# Patient Record
Sex: Male | Born: 1935 | Race: White | Hispanic: No | Marital: Married | Smoking: Former smoker
Health system: Southern US, Community
[De-identification: ages and names within clinical notes are randomized; demographics above are authoritative.]

## PROBLEM LIST (undated history)

## (undated) DIAGNOSIS — E669 Obesity, unspecified: Secondary | ICD-10-CM

## (undated) DIAGNOSIS — R0989 Other specified symptoms and signs involving the circulatory and respiratory systems: Secondary | ICD-10-CM

## (undated) DIAGNOSIS — E1151 Type 2 diabetes mellitus with diabetic peripheral angiopathy without gangrene: Secondary | ICD-10-CM

## (undated) DIAGNOSIS — M5416 Radiculopathy, lumbar region: Secondary | ICD-10-CM

## (undated) DIAGNOSIS — J189 Pneumonia, unspecified organism: Secondary | ICD-10-CM

## (undated) DIAGNOSIS — E1142 Type 2 diabetes mellitus with diabetic polyneuropathy: Secondary | ICD-10-CM

## (undated) DIAGNOSIS — R52 Pain, unspecified: Secondary | ICD-10-CM

## (undated) DIAGNOSIS — I429 Cardiomyopathy, unspecified: Secondary | ICD-10-CM

## (undated) DIAGNOSIS — J449 Chronic obstructive pulmonary disease, unspecified: Secondary | ICD-10-CM

## (undated) DIAGNOSIS — E785 Hyperlipidemia, unspecified: Secondary | ICD-10-CM

## (undated) DIAGNOSIS — I10 Essential (primary) hypertension: Secondary | ICD-10-CM

## (undated) DIAGNOSIS — I739 Peripheral vascular disease, unspecified: Secondary | ICD-10-CM

## (undated) DIAGNOSIS — I639 Cerebral infarction, unspecified: Secondary | ICD-10-CM

## (undated) DIAGNOSIS — I2510 Atherosclerotic heart disease of native coronary artery without angina pectoris: Secondary | ICD-10-CM

## (undated) HISTORY — DX: Atherosclerotic heart disease of native coronary artery without angina pectoris: I25.10

## (undated) HISTORY — DX: Cardiomyopathy, unspecified: I42.9

## (undated) HISTORY — DX: Other specified symptoms and signs involving the circulatory and respiratory systems: R09.89

## (undated) HISTORY — PX: OTHER SURGICAL HISTORY: SHX169

## (undated) HISTORY — DX: Type 2 diabetes mellitus with diabetic peripheral angiopathy without gangrene: E11.51

## (undated) HISTORY — DX: Chronic obstructive pulmonary disease, unspecified: J44.9

## (undated) HISTORY — DX: Hyperlipidemia, unspecified: E78.5

## (undated) HISTORY — DX: Peripheral vascular disease, unspecified: I73.9

## (undated) HISTORY — DX: Cerebral infarction, unspecified: I63.9

## (undated) HISTORY — PX: CORONARY ARTERY BYPASS GRAFT: SHX141

## (undated) HISTORY — DX: Pneumonia, unspecified organism: J18.9

## (undated) HISTORY — DX: Radiculopathy, lumbar region: M54.16

## (undated) HISTORY — DX: Essential (primary) hypertension: I10

## (undated) HISTORY — DX: Pain, unspecified: R52

## (undated) HISTORY — DX: Obesity, unspecified: E66.9

## (undated) HISTORY — DX: Type 2 diabetes mellitus with diabetic polyneuropathy: E11.42

---

## 2006-08-25 ENCOUNTER — Ambulatory Visit: Payer: Self-pay | Admitting: Vascular Surgery

## 2006-09-03 ENCOUNTER — Ambulatory Visit (HOSPITAL_COMMUNITY): Admission: RE | Admit: 2006-09-03 | Discharge: 2006-09-03 | Payer: Self-pay | Admitting: Vascular Surgery

## 2006-09-03 ENCOUNTER — Ambulatory Visit: Payer: Self-pay | Admitting: Vascular Surgery

## 2006-09-22 ENCOUNTER — Ambulatory Visit: Payer: Self-pay | Admitting: Vascular Surgery

## 2006-10-15 ENCOUNTER — Ambulatory Visit: Payer: Self-pay | Admitting: Vascular Surgery

## 2006-10-15 ENCOUNTER — Inpatient Hospital Stay (HOSPITAL_COMMUNITY): Admission: RE | Admit: 2006-10-15 | Discharge: 2006-10-20 | Payer: Self-pay | Admitting: Vascular Surgery

## 2006-10-16 ENCOUNTER — Encounter: Payer: Self-pay | Admitting: Vascular Surgery

## 2006-10-27 ENCOUNTER — Ambulatory Visit: Payer: Self-pay | Admitting: Vascular Surgery

## 2009-02-24 IMAGING — CR DG ANG/EXT/UNI/OR RIGHT
1 series · 1 of 1 positions shown · non-contrast
Comparison: none

CLINICAL DATA: Popliteal stenosis. Superficial femoral artery to anterior tibial
artery bypass

RIGHT EXTREMITY INTRAOPERATIVE ANGIOGRAM:

[view not recorded]
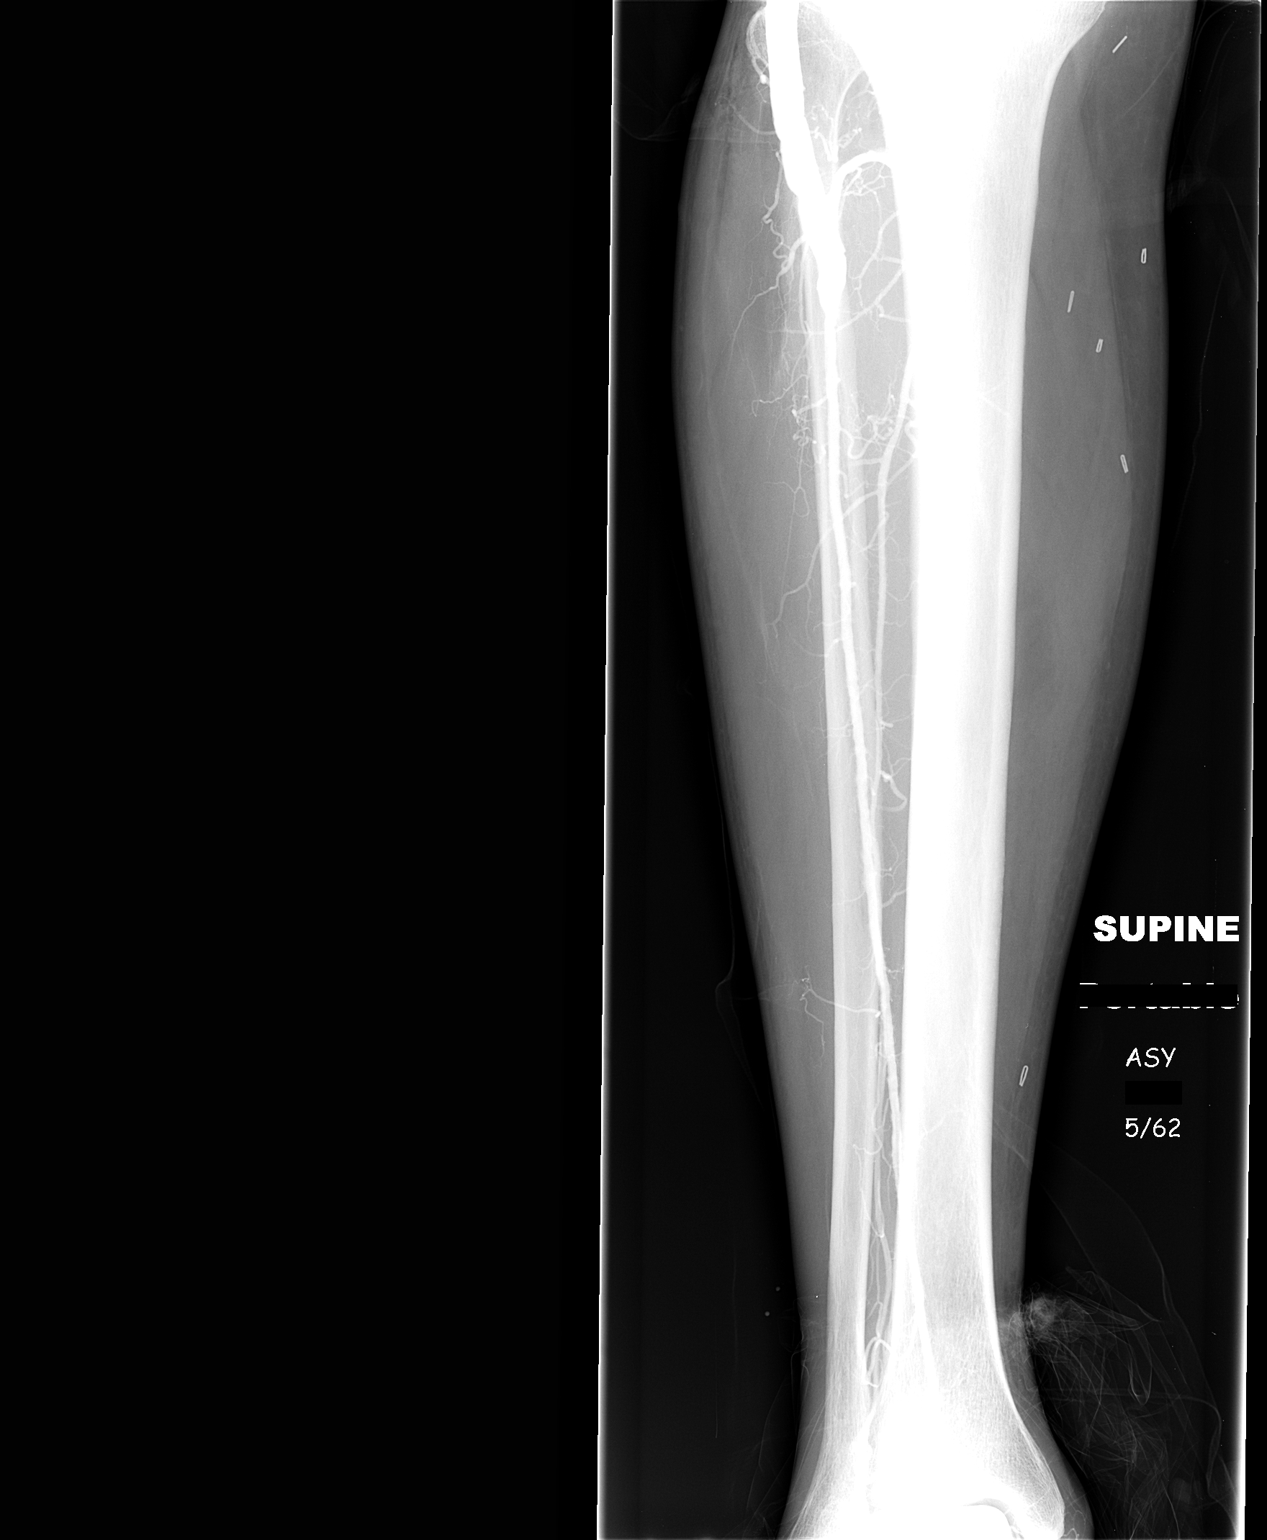

[1 of 1 positions shown; findings below may reference images not displayed]

FINDINGS: Single image is  submitted from an intraoperative angiogram. This
shows the distal portion of a right lower extremity bypass graft, which appears
to the tie into the proximal right anterior tibial artery. Visualized anterior
tibial artery is patent. Dorsalis pedis is noted and patent at the ankle.
Visualized portions of the peroneal artery is patent. Posterior tibial artery
not well visualized beyond the proximal calf.
IMPRESSION: As above.

## 2010-07-16 NOTE — Procedures (Signed)
VASCULAR LAB EXAM   INDICATION:  Evaluation of greater saphenous veins prior to lower  extremity re-vascularization.   HISTORY:  Diabetes:  Non-insulin dependent.  Cardiac:  MI in 1989.  Coronary artery bypass graft at [facility] in 1989 with  right knee to ankle greater saphenous vein harvest.  Hypertension:  Yes.   EXAM:  Bilateral lower extremity greater saphenous vein mapping.   IMPRESSION:  Small saphenous veins are partially compressible, secondary  to chronic thrombus.   Right greater saphenous vein measures 0.5 to 0.2 cm from the groin to  the knee.  Distal to the knee, the vein has been harvested.   Left greater saphenous vein measures 0.9 cm to 0.7 cm, groin to proximal  calf.  The vein penetrates the fascia superficially at the mid-thigh.  Past the proximal left calf, the greater saphenous vein became a  tortuous varicose vein.   ___________________________________________  Quita Skye. Hart Rochester, M.D.   MC/MEDQ  D:  09/22/2006  T:  09/23/2006  Job:  [id number]

## 2010-07-16 NOTE — Procedures (Signed)
VASCULAR LAB EXAM   INDICATION:   HISTORY:  Diabetes:  Cardiac:  Hypertension:   EXAM:   IMPRESSION:   ADDENDUM:   ___________________________________________  Quita Skye. Hart Rochester, M.D.   MC/MEDQ  D:  09/24/2006  T:  09/24/2006  Job:  [id number]

## 2010-07-16 NOTE — Consult Note (Signed)
VASCULAR SURGERY CONSULTATION   Gelpi, YATES  DOB:  01-Mar-1932                                       08/25/2006  CHART#:[id number]   Patient is a 77 year old male patient who has had progressive calf  claudication and ankle discomfort over the last few years, in  particularly over the past several months.  He works at [employer], and  this is effecting his ability to ambulate significantly.  He develops  calf discomfort and ankle discomfort with the feet then becoming numb  and tingling, beginning on the right side.  It requires 10-15 minutes  for this to resolve.  He has had no rest pain, history of infection, or  nonhealing ulcers.  Lower extremity arterial Doppler studies done at  [facility] in April, 2008 revealed evidence of  severe right popliteal artery stenosis and possible occlusion of the  left popliteal artery.   PAST MEDICAL HISTORY:  1. Non-insulin-dependent diabetes.  2. Hypertension.  3. Coronary artery disease with myocardial infarction in 1989.      Negative stress test one year ago.  4. Hyperlipidemia.  5. Negative for COPD and stroke.   PAST SURGICAL HISTORY:  Coronary artery bypass grafting at [facility] in 1989  with removal of right saphenous vein from the knee to the ankle.   FAMILY HISTORY:  Positive for diabetes mellitus in his mother, negative  for coronary artery disease and strokes.   SOCIAL HISTORY:  He is married.  Has two children.  Is retired.  He does  not smoke since 1989, does not use alcohol.   ALLERGIES:  None.   REVIEW OF SYSTEMS:  Please see health history form.   MEDICATIONS:  Please see health history form.   PHYSICAL EXAMINATION:  Blood pressure is 128/72, heart rate 79,  respirations 12.  Generally, he is a male patient.  He is in no apparent  distress.  Alert and oriented x3.  Neck is supple with 3+ carotid pulses  palpable.  No bruits are audible.  Neurologic:  Normal.  No palpable  adenopathy  in the neck.  Chest clear to auscultation.  Cardiovascular  exam reveals a regular rhythm with no murmurs.  His abdomen is soft and  nontender with no palpable masses.  He has 3+ femoral and popliteal  pulse palpable on the right with no distal pulses palpable.  Left foot  has a 3+ femoral and a 1+ popliteal pulse with no distal pulses  palpable.  Both feet are slightly cool but adequately perfused.  There  is no evidence of infection or ischemia.   I suspect that he does have severe popliteal disease bilaterally, and we  will proceed with an angiogram on July 3 and see what options are  available.  If he is a candidate of PTA and stenting of the popliteal  artery, we may proceed at that time.  I have discussed this with him and  his family.  It should be noted that he also has some varicosities in  his left greater saphenous system on the pretibial region on the left  side between the knee and the ankle, but there are no varicosities in  the thigh.  He presumably has greater saphenous vein still present from  the right groin to the knee.   Quita Skye Hart Rochester, M.D.  Electronically Signed  JDL/MEDQ  D:  08/25/2006  T:  08/26/2006  Job:  [id number]   cc:   Dr. Virgie Dad

## 2010-07-16 NOTE — Op Note (Signed)
NAME:  [name] [name], [name] [name] NO.:  [id number]   MEDICAL RECORD NO.:  [id number]          PATIENT TYPE:  INP   LOCATION:  3313                         FACILITY:  [facility]   PHYSICIAN:  Quita Skye. Hart Rochester, M.D.  DATE OF BIRTH:  10-23-1931   DATE OF PROCEDURE:  10/15/2006  DATE OF DISCHARGE:                               OPERATIVE REPORT   PREOPERATIVE DIAGNOSIS:  Severe claudication, right leg, with some rest  ischemia secondary to superficial femoral, popliteal and tibial  occlusive disease.   POSTOPERATIVE DIAGNOSIS:  Severe claudication, right leg, with some rest  ischemia secondary to superficial femoral, popliteal and tibial  occlusive disease.   OPERATION:  Right proximal superficial femoral artery to anterior tibial  artery bypass using a non-reverse translocated saphenous vein graft from  the left leg with intraoperative arteriogram.   FIRST ASSISTANT:  Shonna Chock.   ANESTHESIA:  General endotracheal.   PROCEDURE:  The patient was taken to the operating room, placed in the  supine position at which time satisfactory general endotracheal  anesthesia was administered.  Both legs were prepped with Betadine scrub  solution and draped in a routine sterile manner.  Saphenous vein was  exposed at the saphenofemoral junction on the left side and removed down  to the proximal calf.  There was valvular incompetence throughout the  saphenous vein.  It was a very large vein but no varicosities were  located in the vein itself down to the midcalf where it became involved  with multiple varicosities in the branches.  The vein was removed, it  gently dilated heparinized saline, marked for orientation purposes.  It  was a diffusely large vein but was adequate for use.  Anterior tibial  artery was exposed through a lateral incision in the proximal aspect of  the right leg where it was a mildly diseased vessel and a large vessel  but widely patent on the angiogram.   Superficial femoral artery was then  exposed in the proximal third of the thigh where there was adequate  length for the vein that was available.  It was dissected free through  one longitudinal incision anteriorly and it was a diffusely diseased  vessel but had an excellent pulse.  The subcutaneous lateral tunnel was  then created and the patient was heparinized.  The superficial femoral  artery was occluded proximally and distally, opened with a 15 blade and  extended with Potts scissors.  Proximal end of the vein was spatulated,  anastomosed end-to-side with 6-0 Prolene.  Clamps were then released.  The valvulotome was passed up the vein but no valves were incompetent.  Following this the vein was carefully delivered through the tunnel then  anastomosed end-to-side to the anterior tibial artery in an end-to-side  fashion with 6-0 Prolene.  Anterior tibial artery itself was widely  patent but diffusely diseased and at least 2.5 mm in size.  Following  placed in this there was an intraoperative arteriogram performed which  revealed a widely patent anastomosis with one-vessel runoff to the  anterior tibial artery.  Protamine was given to reverse the heparin.  Following adequate hemostasis wound was irrigated with saline, closed in  layers with Vicryl in subcuticular fashion, sterile dressing applied.  The patient was taken to recovery room in satisfactory condition.      Quita Skye Hart Rochester, M.D.  Electronically Signed     JDL/MEDQ  D:  10/15/2006  T:  10/16/2006  Job:  [id number]

## 2010-07-16 NOTE — H&P (Signed)
HISTORY AND PHYSICAL EXAMINATION   September 22, 2006   Re:  [name], [name] VALENZA               DOB:  1931-12-02   CHIEF COMPLAINT:  Severe claudication of bilateral lower extremities,  right worse than left, secondary to popliteal and tibial occlusive  disease.   HISTORY OF PRESENT ILLNESS:  This 77 year old male patient has developed  progressive, severe calf claudication and ankle discomfort over the past  few years, particularly over the last several months.  He works at [employer] and this is affecting his ability to ambulate significantly.  He  has severe calf discomfort and also ankle discomfort and then the feet  become numb and tingling, beginning on the right side.  It requires 10-  15 minutes for this to resolve.  He has no history of rest pain,  infection or nonhealing ulcer.  Lower extremity arterial Dopplers at  [facility] in April 2008 revealed severe popliteal disease  bilaterally and an angiogram performed by Dr. Hart [name] revealed widely  patent but diffusely diseased superficial femoral arteries bilaterally  with severe popliteal disease, the best runoff vessel below the knee  being the anterior tibial artery on both sides.  He was scheduled for  superficial femoral to anterior tibial bypass grafting on the right.   PAST MEDICAL HISTORY:  1. Noninsulin-dependent diabetes mellitus.  2. Hypertension.  3. Coronary artery disease, myocardial infarction in 1985.  Recent      negative stress test done at [facility] on September 16, 2006,      with no evidence of ischemia, cleared for surgery.  4. Hyperlipidemia.  5. Negative for COPD and stroke.   Previous surgery includes coronary artery bypass grafting at [facility] in  1989 with removal of right saphenous vein from the knee to the ankle.   FAMILY HISTORY:  Positive for diabetes mellitus in his mother.  Negative  for coronary artery disease and stroke.   SOCIAL HISTORY:  He is married and has 2  children.  He is retired.  Has  not smoked since 1989.  Does not use alcohol.   ALLERGIES:  None known.   REVIEW OF SYSTEMS:  Denies any chest pain, dyspnea on exertion, PND,  orthopnea, anorexia, weight loss, or any other generalized symptoms.  Denies productive cough, bronchitis, asthma, wheezing or hemoptysis.  No  other generalized symptoms noted.   MEDICATIONS:  1. Slo-Niacin 500 mcg daily.  2. Metformin 500 mg four daily.  3. Nadolol 80 mg one at night.  4. Lipitor 80 mg one at night.  5. Enalapril maleate 20 mg two daily.  6. Lyrica 75 mg one daily.   PHYSICAL EXAMINATION:  His blood pressure is 117/69, heart rate 79,  respirations are 18.  General:  He is a male patient who is in no acute  distress, alert and oriented x3.  His neck is supple, 3+ carotid pulses  palpable.  No bruits are audible.  Neurologic exam is normal.  No  palpable adenopathy in the neck.  Chest:  Clear to auscultation.  Cardiovascular exam reveals a regular rhythm with no murmur.  Abdomen:  Soft, nontender, with no palpable mass.  He has 3+ femoral and popliteal  pulse on the right with no distal pulses palpable.  Left foot has a 3+  femoral and 1+ popliteal pulse, and no distal pulse is palpable.  Both  feet are slightly cool but adequately perfused.  He does have some  varicose veins in the left leg beginning in the calf distally.  The  right greater saphenous vein has been removed from the ankle to the  knee.  Recent vein mapping revealed greater saphenous vein to be present  and adequate down to the mid calf and the right greater saphenous vein  to be borderline but adequate from the groin to the distal thigh.   IMPRESSION:  1. Severe claudication and some rest ischemia, right leg greater than      the left, secondary to popliteal and tibial occlusive disease.  2. Coronary artery disease, currently asymptomatic, negative stress      test.  3. Noninsulin-dependent diabetes mellitus.  4.  Hypertension.  5. Hyperlipidemia.   Plan is to admit the patient August 14 for a right superficial femoral  to anterior tibial bypass, probably using vein from right leg but  possibly using vein from left leg as well.  The risks and benefits have  been discussed with the patient and he would like to proceed.   [name] [name], M.D.  Electronically Signed   JDL/MEDQ  D:  09/22/2006  T:  09/23/2006  Job:  [id number]   cc:   Harl Bowie, M.D.

## 2010-07-16 NOTE — Op Note (Signed)
NAME:  [name] [name], [name] [name]               ACCOUNT NO.:  [id number]   MEDICAL RECORD NO.:  [id number]          PATIENT TYPE:  AMB   LOCATION:  SDS                          FACILITY:  [facility]   PHYSICIAN:  Quita Skye. Hart Rochester, M.D.  DATE OF BIRTH:  [birth year]   DATE OF PROCEDURE:  09/03/2006  DATE OF DISCHARGE:                               OPERATIVE REPORT   PREOPERATIVE DIAGNOSIS:  Severe bilateral calf claudication and rest  ischemia secondary to popliteal and tibial occlusive disease.   PROCEDURES:  1. Abdominal aortogram with bilateral lower extremity runoff via left      common femoral approach.  2. Selective catheterization, right common iliac artery, with right      lower extremity angiogram.   SURGEON:  Quita Skye. Hart Rochester, M.D.   ANESTHESIA:  Local Xylocaine.   CONTRAST:  160 mL.   COMPLICATIONS:  None.   DESCRIPTION OF PROCEDURE:  The patient was taken to [facility]  peripheral endovascular lab, placed in the supine position, at which  time both groins were prepped with Betadine solution, draped in a  routine sterile manner.  After infiltration of 1% Xylocaine, the left  femoral artery was entered.  A guidewire was passed into the suprarenal  aorta under fluoroscopic guidance.  A 5-French sheath and dilator were  passed over the guidewire, dilator removed, and a standard pigtail  catheter positioned in the suprarenal aorta.  Flush abdominal aortogram  was performed injecting 20 mL of contrast at 20 mL per second.  This  revealed the aorta to be widely patent but tortuous.  There were single  renal arteries bilaterally with no areas of significant stenosis.  The  inferior mesenteric artery was patent.  Both common, internal and  external iliac arteries were tortuous but widely patent.  The catheter  was withdrawn into the terminal aorta and bilateral lower extremity  runoff performed, injecting 88 mL of contrast at 8 mL per second.  On  the left side the common femoral, superficial  and profunda femoris  arteries were widely patent with no stenosis.  The SFA distally was also  widely patent but the popliteal artery became severely diseased and  essentially totally occluded at the knee joint with reconstitution of  the below-knee popliteal artery, which had severe disease at the origin  of the tibioperoneal trunk and the origin of the anterior tibial artery.  Runoff was through the anterior tibial and peroneal arteries on the left  with the posterior tibial being totally occluded.  The anterior tibial  artery was a good vessel throughout most of its length except for the  proximal 2-3 cm.  The right common iliac artery was then selectively  cannulated with an IMA catheter and right lower extremity angiogram  performed using the peek-hold technique to supplement the findings of  the bilateral lower extremity runoff.  This revealed the right common  femoral, profunda femoral and superficial femoral arteries to be patent  with no significant stenosis until the distal thigh, where there was  some mild narrowing of the superficial femoral artery in the 30% range.  There was  total occlusion of the popliteal artery essentially at the  level of the knee joint with multiple severely calcified plaques  extending down to the tibioperoneal trunk.  The posterior tibial artery  was occluded.  The anterior tibial was opened from its origin distally  and was the best vessel, and the peroneal artery was also opened  throughout.  Having tolerated procedure well, the sheath was removed,  adequate compression applied.  No complications ensued.   FINDINGS:  1. A widely patent aortoiliac system.  2. Bilateral patent superficial femoral arteries.  3. Total occlusion of the left popliteal artery with two-vessel runoff      via peroneal and anterior tibial arteries, with severe disease of      the tibioperoneal trunk.  4. Severe occlusive disease of right popliteal artery with total       occlusion of the right posterior tibial artery with best runoff via      the anterior tibial and peroneal artery.      Quita Skye Hart Rochester, M.D.  Electronically Signed     JDL/MEDQ  D:  09/03/2006  T:  09/03/2006  Job:  [id number]

## 2010-07-16 NOTE — Discharge Summary (Signed)
NAME:  [name] [name], [name] [name] NO.:  [id number]   MEDICAL RECORD NO.:  [id number]          PATIENT TYPE:  INP   LOCATION:  2024                         FACILITY:  [facility]   PHYSICIAN:  Quita Skye. Hart Rochester, M.D.  DATE OF BIRTH:  Jun 06, 1931   DATE OF ADMISSION:  10/15/2006  DATE OF DISCHARGE:  10/20/2006                               DISCHARGE SUMMARY   This is an addendum to a previously dictated discharge summary, see job  number [id number] for details, outlining patient's admission and discharge  diagnoses, procedures, history and hospital course through October 19, 2006.   CONTINUATION OF HOSPITAL COURSE:  As previously dictated, it was  anticipated that [name] [name] would be ready for discharge home on October 20, 2006; however, the night before he complained of voiding very  frequently with a sensation of burning.  On physical exam, it was felt  that his bladder was somewhat distended; therefore a Foley catheter was  inserted and revealed 1400 mL of urine.  This was sent for urinalysis  and urine culture and sensitivities, but results are still pending.  Dr.  Hart Rochester spoke with urologist, Dr. Gaynelle [name], who felt that the patient  should be started on Flomax nightly and would be okay for discharge home  with his Foley catheter with a night time drainage bag and leg drainage  bag with plans for him to be seen by the nurse practitioner in 1 week.  Patient was in agreement with this plan and subsequently discharged home  on October 20, 2006.  A home health nurse has been ordered to assist with  Foley catheter followup once he gets home.  Of note, we also discussed  continuation of his use of Lyrica.  This had initially been started for  his severe claudication of the right leg by a family doctor.  The  patient has a few tablets left at home.  At this point, the plan is for  him to complete these, then see how he does without the Lyrica, but a 1  month new prescription was written  in case he still needed Lyrica for  pain control, but I did discuss that he should not need to be on this  long term since he is now status post bypass, which should improve his  claudication symptoms.   ADDITIONAL DISCHARGE DIAGNOSES:  Overflow incontinence requiring  reinsertion of Foley catheter.   UPDATED DISCHARGE MEDICATION LIST:  1. Lyrica 75 mg p.o. q.a.m., with plans to discontinue this as his      pain improves.  2. Metformin 500 mg 2 tablets p.o. b.i.d.  3. Nadolol 80 mg p.o. q.h.s.  4. Enalapril 20 mg p.o. b.i.d.  5. Lipitor 80 mg p.o. q.h.s.  6. Niacin 500 mg p.o. q.h.s.  7. Percocet 5/325 mg 1-2 tablets p.o. q.4 hours p.r.n. pain.  8. Flomax 0.4 mg p.o. q.h.s.   DISCHARGE INSTRUCTIONS:  Discharge instructions are as previously  dictated with the exception that he is now being discharged home with a  Foley catheter.  While in the hospital, the nurse will go over  Foley  catheter care  at home with instructions regarding exchanging leg bag and night time  drainage bag.  He is to call Dr. Gaynelle [name]' office at [phone] for  followup appointment with his nurse practitioner, [name] [name], on October 27, 2006.   I have also asked that the urine culture results be sent to Dr. Gaynelle [name]' office when available.      [name] [name], P.A.      Quita Skye Hart Rochester, M.D.  Electronically Signed    AWZ/MEDQ  D:  10/20/2006  T:  10/20/2006  Job:  [id number]   cc:   [name] [name], M.D.  [facility]  [name] [name], M.D.

## 2010-07-16 NOTE — Assessment & Plan Note (Signed)
OFFICE VISIT   Staiger, STEPHAN NELIS  DOB:  August 06, 1931                                       10/27/2006  ZOXWR#:[id number]   The patient is status post right superficial femoral anterior tibial  bypass for severe claudication symptoms in the right leg with lesser  symptoms in the left leg, both of which were limiting.  His distal  saphenous vein had been removed from the right leg for coronary artery  bypass grafting.  We removed his left saphenous vein which was a large  vein because of valvular incompetence, and used it on the right side for  superficial femoral to anterior tibial bypass which has functioned  nicely.  His claudication symptoms have been relieved.   On exam, he has an excellent pulse in the graft which runs anterolateral  to the knee, and a well-perfused right foot with nice healing of the  incisions.  He has no distal edema.  The left leg has popliteal  occlusion with a patent above-knee superficial femoral artery and a  below-knee popliteal artery, and he is a candidate for superficial  femoral artery to popliteal grafting using the residual vein from his  right leg.  He will see how his symptoms progress, and if he would like  to proceed with that be back in touch with [place], otherwise he will be  followed on the protocol.   Quita Skye Hart Rochester, M.D.  Electronically Signed   JDL/MEDQ  D:  10/27/2006  T:  10/28/2006  Job:  [id number]

## 2010-07-16 NOTE — Discharge Summary (Signed)
NAME:  [name] [name], [name] [name] NO.:  [id number]   MEDICAL RECORD NO.:  [id number]          PATIENT TYPE:  INP   LOCATION:  2024                         FACILITY:  [facility]   PHYSICIAN:  Quita Skye. Hart Rochester, M.D.  DATE OF BIRTH:  1931/03/29   DATE OF ADMISSION:  10/15/2006  DATE OF DISCHARGE:                               DISCHARGE SUMMARY   ADMISSION DIAGNOSIS:  Severe claudication, right leg with rest pain  ischemia secondary to superficial femoral popliteal and tibial occlusive  disease.   DISCHARGE/SECONDARY DIAGNOSES:  1. Severe claudication, right leg with rest pain ischemia secondary to      superficial femoral popliteal and tibial occlusive disease status      post right superficial femoral anterior tibial artery bypass.  2. Diabetes mellitus type 2.  3. Hypertension.  4. Coronary artery disease with history of myocardial infarction in      1985 (resent negative stress test at [facility] in July of      2008).  5. Hyperlipidemia.  6. Coronary artery bypass grafting at [facility]      with removal of right saphenous vein from the right knee to ankle.  7. Recently treated for shingles.   PROCEDURES:  On October 15, 2006, he underwent right superficial femoral  artery to anterior tibial artery bypass using non-reverse translocated  saphenous vein graft from the left lower extremity with intraoperative  arteriogram x1.   SURGEON:  Dr. Josephina Gip   BRIEF HISTORY:  [name] [name] is a 77 year old Caucasian male who  developed progressive severe claudication and ankle discomfort over the  past few years, particularly over the last several months.  He works at  [employer] and felt this was affecting his ability to ambulate  significantly.  He complained of severe calf discomfort and ankle  discomfort.  In addition, his feet were becoming numb and tingly, but  more so on the right side.  It required about ten to 15 minutes for this  to resolve.  He  denies history of rest pain, infection or nonhealing  ulcerations.  He had a lower extremity arterial Doppler study at  [facility] in April of 2008 which revealed severe popliteal  disease bilaterally.  He subsequently underwent an angiogram by Dr.  Hart Rochester revealing widely patent, but diffusely diseased superficial  femoral arteries bilaterally with severe popliteal disease.  It appears  that the best run off is below the knee with anterior tibial artery on  both sides.  Dr. Hart Rochester recommended superficial femoral anterior tibial  artery bypass grafting.  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 Hart Rochester  discussed using vein from his left leg in order to avoid splicing vein.   HOSPITAL COURSE:  [name] [name] was electively admitted to [facility] October 15, 2006; he underwent the above-mentioned procedure.  His postoperative ABIs were greater than 1.0 on the right and the ABI on  the left was not obtainable due to calcification.  He had a PT pressure  of 120 with dampened  monophasic signal, anterior tibial was greater than  300 with monophasic signal; this was on the left.  At the time of  dictation, his postoperative course has been relatively uneventful  although he has been slow to mobilize.  Pain was initially an issue, but  deemed to be improving day to day.  He did request rolling walker for  home and this has been arranged.  The incisions have been healing well  without signs of infection.  He has a patent right femoral arterial  tibial artery graft.  He did have some positive constipation  postoperatively and has required laxatives, stool softeners and Fleet  enemas  have been ordered if needed, with no results.  He has been  tolerating a diabetic-appropriate diet and has been on a NovoLog insulin  sliding scale.  His metformin was resumed on postoperative day three and  sugars have been  relatively well controlled.  Hemodynamically, he has  been stable; last vitals show blood pressure 110/68, heart rate of 60s  to 70s in sinus rhythm; he has been afebrile and saturating 94% on room  air.  He has had some mild lower extremity edema, more pronounced on the  right side than the left, but it appears to be improving with elevation.  He was able to void following removal of his Foley catheter.  He was  actually felt ready for discharge around postoperative day four, but  still was not ambulating much outside of his room and again was having  constipation.  Therefore, it was felt he should remain hospitalized for  an additional day with plans to discharge home on postoperative day  five, October 20, 2006, if he continues to make progress with mobility.  Will consider home health physical therapy evaluation if necessary.  Currently, he remains in a stable condition.   His postoperative labs show a white blood count of 8.4, hemoglobin 11.1,  hematocrit 32, platelet count 132, sodium 135, potassium 4.2, BUN 13,  creatinine 0.95 and a blood glucose 123.  Preoperative LFTs were normal  except an alkaline phosphatase was slightly decreased at 31.   DISCHARGE MEDICATIONS:  1. __________ p.o. q a.m.  2. Metformin 500 two tablets p.o. b.i.d.  3. Labetalol 80 mg p.o. q.h.s.  4. Enalapril 20 mg p.o. b.i.d.  5. Lipitor 80 mg p.o. q.h.s.  6. __________ 500 mg p.o. q.h.s.  7. Percocet 5/325 one to two tablets p.o. q.4 hours p.r.n. pain.   DISCHARGE INSTRUCTIONS:  1. He is to continue diabetic-appropriate diet, clean his incision      with soap and water, increase activity slowly, avoid driving and      heavy lifting for the next three weeks; he may shower.  2. Should call if he develops fever greater than 101 or redness or      drainage from his incision site or increased pain.  3. See Dr. Hart Rochester __________ approximately three weeks with      postoperative ABIs and Doppler study and  staple removal appointment      on October 27, 2006; our office will contact him regarding specific      appointment date and time.      Jerold Coombe, P.A.      Quita Skye Hart Rochester, M.D.  Electronically Signed    AWZ/MEDQ  D:  10/19/2006  T:  10/19/2006  Job:  [id number]   cc:   Harl Bowie, M.D.  [facility]

## 2010-10-03 DIAGNOSIS — G629 Polyneuropathy, unspecified: Secondary | ICD-10-CM | POA: Insufficient documentation

## 2010-10-03 DIAGNOSIS — E119 Type 2 diabetes mellitus without complications: Secondary | ICD-10-CM | POA: Insufficient documentation

## 2010-10-03 DIAGNOSIS — B351 Tinea unguium: Secondary | ICD-10-CM

## 2010-12-13 LAB — CBC
HCT: 32 — ABNORMAL LOW
MCHC: 34.5
MCV: 93.7
Platelets: 132 — ABNORMAL LOW

## 2010-12-13 LAB — URINE CULTURE: Colony Count: NO GROWTH

## 2010-12-13 LAB — BASIC METABOLIC PANEL
BUN: 13
CO2: 30
Chloride: 100
Creatinine, Ser: 0.95

## 2010-12-16 LAB — COMPREHENSIVE METABOLIC PANEL
AST: 22
Albumin: 3.9
Calcium: 9.2
Chloride: 105
Creatinine, Ser: 0.79
GFR calc Af Amer: >60
Total Bilirubin: 0.7
Total Protein: 6.7

## 2010-12-16 LAB — CBC
MCV: 92.2
Platelets: 156
RDW: 13.6
WBC: 6.3

## 2010-12-16 LAB — ABO/RH: ABO/RH(D): A POS

## 2010-12-16 LAB — PROTIME-INR: INR: 0.9

## 2010-12-16 LAB — APTT: aPTT: 32

## 2010-12-16 LAB — URINALYSIS, ROUTINE W REFLEX MICROSCOPIC
Nitrite: NEGATIVE
Specific Gravity, Urine: 1.031 — ABNORMAL HIGH
Urobilinogen, UA: 0.2
pH: 6

## 2010-12-16 LAB — CROSSMATCH
ABO/RH(D): A POS
Antibody Screen: NEGATIVE

## 2010-12-17 LAB — COMPREHENSIVE METABOLIC PANEL
AST: 24
Albumin: 3.9
BUN: 20
CO2: 26
Calcium: 9.7
Chloride: 104
Creatinine, Ser: 0.66
GFR calc Af Amer: >60
GFR calc non Af Amer: >60
Total Bilirubin: 0.9

## 2010-12-17 LAB — PROTIME-INR: INR: 0.9

## 2010-12-17 LAB — CBC
HCT: 41.7
MCHC: 34.1
MCV: 93.2
Platelets: 175
WBC: 7.3

## 2010-12-17 LAB — APTT: aPTT: 27

## 2012-12-06 ENCOUNTER — Other Ambulatory Visit: Payer: Self-pay | Admitting: *Deleted

## 2012-12-23 ENCOUNTER — Ambulatory Visit: Payer: Self-pay

## 2013-01-03 ENCOUNTER — Encounter: Payer: Self-pay | Admitting: Vascular Surgery

## 2013-01-04 ENCOUNTER — Other Ambulatory Visit: Payer: Self-pay

## 2013-01-04 ENCOUNTER — Encounter: Payer: Self-pay | Admitting: Vascular Surgery

## 2013-01-04 ENCOUNTER — Ambulatory Visit (INDEPENDENT_AMBULATORY_CARE_PROVIDER_SITE_OTHER): Payer: Medicare Other | Admitting: Vascular Surgery

## 2013-01-04 ENCOUNTER — Ambulatory Visit (HOSPITAL_COMMUNITY)
Admission: RE | Admit: 2013-01-04 | Discharge: 2013-01-04 | Disposition: A | Discharge status: Home / Self Care | Payer: Medicare Other | Source: Ambulatory Visit | Attending: Vascular Surgery | Admitting: Vascular Surgery

## 2013-01-04 VITALS — BP 159/86 | HR 87 | Resp 20 | Ht 69.0 in | Wt 223.0 lb

## 2013-01-04 DIAGNOSIS — Z48812 Encounter for surgical aftercare following surgery on the circulatory system: Secondary | ICD-10-CM | POA: Insufficient documentation

## 2013-01-04 DIAGNOSIS — I739 Peripheral vascular disease, unspecified: Secondary | ICD-10-CM | POA: Insufficient documentation

## 2013-01-04 DIAGNOSIS — Z01810 Encounter for preprocedural cardiovascular examination: Secondary | ICD-10-CM

## 2013-01-04 DIAGNOSIS — I771 Stricture of artery: Secondary | ICD-10-CM | POA: Insufficient documentation

## 2013-01-04 NOTE — Progress Notes (Addendum)
Subjective:     Patient ID: [name] [name], male   DOB: 01-04-1932, 79 y.o.   MRN: [id number]  HPI this 79 year old male is known to me having previously undergone right proximal superficial femoral to anterior tibial bypass using saphenous vein from left leg in 2008 for severe claudication symptoms. Vein graft is functioning nicely over the last 6 years but he is now having severe claudication in the right calf and also some degree of rest discomfort which is atypical in the thigh and calf area. He was evaluated by Dr. Dulce Sellar  who obtained an arterial duplex scan which revealed total occlusion of anterior tibial artery distal to the vein graft. He was referred for further evaluation. He denies any nonhealing ulcers or gangrene or infection in the right foot. He does have numbness in the right foot which is a chronic problem due to neuropathy.  Past Medical History  Diagnosis Date  . Diabetes mellitus   . Painful veins   . Peripheral arterial disease   . Hypertension   . Hyperlipidemia   . CAD (coronary artery disease)   . Cardiomyopathy     History  Substance Use Topics  . Smoking status: Former Smoker    Types: Cigarettes    Quit date: 01/04/1993  . Smokeless tobacco: Never Used  . Alcohol Use: No    Family History  Problem Relation Age of Onset  . Cancer Mother   . Diabetes Mother     No Known Allergies  Current outpatient prescriptions:aspirin 81 MG tablet, Take 81 mg by mouth daily., Disp: , Rfl: ;  carvedilol (COREG) 25 MG tablet, Take 25 mg by mouth 2 (two) times daily with a meal. Take 1 1/2 tablet every 12 hours, Disp: , Rfl: ;  enalapril (VASOTEC) 20 MG tablet, Take 20 mg by mouth daily.  , Disp: , Rfl: ;  fish oil-omega-3 fatty acids 1000 MG capsule, Take 2 g by mouth daily., Disp: , Rfl:  Metformin HCl 500 MG/5ML SOLN, Take by mouth.  , Disp: , Rfl: ;  nabumetone (RELAFEN) 750 MG tablet, Take 750 mg by mouth 2 (two) times daily., Disp: , Rfl: ;  niacin (NIASPAN) 500 MG  CR tablet, Take 500 mg by mouth at bedtime.  , Disp: , Rfl: ;  rosuvastatin (CRESTOR) 40 MG tablet, Take 40 mg by mouth daily., Disp: , Rfl: ;  atorvastatin (LIPITOR) 40 MG tablet, Take 40 mg by mouth daily.  , Disp: , Rfl:  nadolol (CORGARD) 80 MG tablet, Take 80 mg by mouth daily.  , Disp: , Rfl:   BP 159/86  Pulse 87  Resp 20  Ht 5\' 9"  (1.753 m)  Wt 223 lb (101.152 kg)  BMI 32.92 kg/m2  Body mass index is 32.92 kg/(m^2).         Review of Systems denies chest pain, dyspnea on exertion, PND, orthopnea, hemoptysis. Main problem is pain in right leg as outlined and numbness in right foot. Other systems is negative and complete review of symptoms    Objective:   Physical Exam BP 159/86  Pulse 87  Resp 20  Ht 5\' 9"  (1.753 m)  Wt 223 lb (101.152 kg)  BMI 32.92 kg/m2  Gen.-alert and oriented x3 in no apparent distress HEENT normal for age Lungs no rhonchi or wheezing Cardiovascular regular rhythm no murmurs carotid pulses 3+ palpable no bruits audible Abdomen soft nontender no palpable masses Musculoskeletal free of  major deformities Skin clear -no rashes Neurologic normal Lower extremities  3+ femoral pulses bilaterally. Right leg has a subcutaneously placed femoral to anterior tibial bypass which is easily visible beneath the skin-dilated-has an excellent pulse. No palpable pulses in the right foot. He does have intact motion and decreased sensation. Left leg has no palpable popliteal or distal pulses.  I reviewed the lower extremity arterial study report performed in Dr. Hulen Shouts office which revealed calcified tibial vessels on the right with noncompressible vessels. It appears that the anterior tibial artery is totally occluded distal to the functioning vein graft on the right.       Assessment:     Recurrent severe claudication and some degree of rest pain-atypical with functioning right femoral anterior tibial vein graft and severe tibial occlusive disease    Plan:      #1 we'll obtain upper Trinity vein mapping today in the office #2 schedule for angiogram per Dr. Standley Brooking on Thursday, November 6 to see if patient candidate for revision of femoral anterior tibial bypass      patient had bilateral upper extremity vein mapping in the office today which are reviewed and interpreted. He is left-handed. It appears that his right upper arm cephalic vein would be satisfactory for conduit if he needs a short segment of vein

## 2013-01-05 MED ORDER — SODIUM CHLORIDE 0.9 % IV SOLN
INTRAVENOUS | Status: DC
  Administered 2013-01-06: 08:00:00 via INTRAVENOUS

## 2013-01-06 ENCOUNTER — Ambulatory Visit (HOSPITAL_COMMUNITY)
Admission: RE | Admit: 2013-01-06 | Discharge: 2013-01-06 | Disposition: A | Discharge status: Home / Self Care | Payer: Medicare Other | Source: Ambulatory Visit | Attending: Vascular Surgery | Admitting: Vascular Surgery

## 2013-01-06 ENCOUNTER — Other Ambulatory Visit: Payer: Self-pay | Admitting: *Deleted

## 2013-01-06 ENCOUNTER — Encounter (HOSPITAL_COMMUNITY)
Admission: RE | Disposition: A | Discharge status: Home / Self Care | Payer: Self-pay | Source: Ambulatory Visit | Attending: Vascular Surgery

## 2013-01-06 ENCOUNTER — Telehealth: Payer: Self-pay | Admitting: Vascular Surgery

## 2013-01-06 DIAGNOSIS — I10 Essential (primary) hypertension: Secondary | ICD-10-CM | POA: Insufficient documentation

## 2013-01-06 DIAGNOSIS — I2510 Atherosclerotic heart disease of native coronary artery without angina pectoris: Secondary | ICD-10-CM | POA: Insufficient documentation

## 2013-01-06 DIAGNOSIS — E785 Hyperlipidemia, unspecified: Secondary | ICD-10-CM | POA: Insufficient documentation

## 2013-01-06 DIAGNOSIS — I70219 Atherosclerosis of native arteries of extremities with intermittent claudication, unspecified extremity: Secondary | ICD-10-CM | POA: Insufficient documentation

## 2013-01-06 DIAGNOSIS — I739 Peripheral vascular disease, unspecified: Secondary | ICD-10-CM

## 2013-01-06 DIAGNOSIS — I428 Other cardiomyopathies: Secondary | ICD-10-CM | POA: Insufficient documentation

## 2013-01-06 DIAGNOSIS — Z48812 Encounter for surgical aftercare following surgery on the circulatory system: Secondary | ICD-10-CM

## 2013-01-06 DIAGNOSIS — E119 Type 2 diabetes mellitus without complications: Secondary | ICD-10-CM | POA: Insufficient documentation

## 2013-01-06 DIAGNOSIS — G5790 Unspecified mononeuropathy of unspecified lower limb: Secondary | ICD-10-CM | POA: Insufficient documentation

## 2013-01-06 HISTORY — PX: LOWER EXTREMITY ANGIOGRAM: SHX5508

## 2013-01-06 LAB — POCT I-STAT, CHEM 8
Calcium, Ion: 1.2 mmol/L (ref 1.13–1.30)
Creatinine, Ser: 0.7 mg/dL (ref 0.50–1.35)
HCT: 37 % — ABNORMAL LOW (ref 39.0–52.0)
Hemoglobin: 12.6 g/dL — ABNORMAL LOW (ref 13.0–17.0)
Potassium: 4.3 mEq/L (ref 3.5–5.1)
Sodium: 137 mEq/L (ref 135–145)
TCO2: 24 mmol/L (ref 0–100)

## 2013-01-06 SURGERY — ANGIOGRAM, LOWER EXTREMITY
Anesthesia: LOCAL

## 2013-01-06 MED ORDER — OXYCODONE-ACETAMINOPHEN 5-325 MG PO TABS: 1.0000 | ORAL_TABLET | ORAL | Status: DC | PRN

## 2013-01-06 MED ORDER — MIDAZOLAM HCL 2 MG/2ML IJ SOLN
INTRAMUSCULAR | Status: AC
  Filled 2013-01-06: qty 2

## 2013-01-06 MED ORDER — MORPHINE SULFATE 2 MG/ML IJ SOLN: 2.0000 mg | INTRAMUSCULAR | Status: DC | PRN

## 2013-01-06 MED ORDER — ACETAMINOPHEN 325 MG PO TABS: 650.0000 mg | ORAL_TABLET | ORAL | Status: DC | PRN

## 2013-01-06 MED ORDER — LIDOCAINE HCL (PF) 1 % IJ SOLN
INTRAMUSCULAR | Status: AC
  Filled 2013-01-06: qty 30

## 2013-01-06 MED ORDER — FENTANYL CITRATE 0.05 MG/ML IJ SOLN
INTRAMUSCULAR | Status: AC
  Filled 2013-01-06: qty 2

## 2013-01-06 MED ORDER — HEPARIN (PORCINE) IN NACL 2-0.9 UNIT/ML-% IJ SOLN
INTRAMUSCULAR | Status: AC
  Filled 2013-01-06: qty 1000

## 2013-01-06 MED ORDER — SODIUM CHLORIDE 0.9 % IV SOLN: INTRAVENOUS | Status: DC

## 2013-01-06 MED ORDER — ONDANSETRON HCL 4 MG/2ML IJ SOLN: 4.0000 mg | Freq: Four times a day (QID) | INTRAMUSCULAR | Status: DC | PRN

## 2013-01-06 NOTE — Interval H&P Note (Signed)
[facility]  History and Physical Update  The patient was interviewed and re-examined.  The patient's previous History and Physical has been reviewed and is unchanged.  There is no change in the plan of care: RLE angiogram, possible intervention.  Leonides Sake, MD [facility] [place] Office: [phone] Pager: [phone]  01/06/2013, 9:20 AM

## 2013-01-06 NOTE — Op Note (Signed)
OPERATIVE NOTE   PROCEDURE: 1.  Left common femoral artery cannulation under ultrasound guidance 2.  Aortogram 3.  Second order arterial selection 4.  Right leg runoff  PRE-OPERATIVE DIAGNOSIS: Right leg claudication with possible anterior tibial artery occlusion  POST-OPERATIVE DIAGNOSIS: same as above   SURGEON: Leonides Sake, MD  ANESTHESIA: conscious sedation  ESTIMATED BLOOD LOSS: 50 cc  CONTRAST: 75 cc  FINDING(S):  Aorta: patent but heavily calcified  Superior mesenteric artery: proximally patent, distally not visualized well  Celiac artery: not seen  Right Left  RA Patent Patent  CIA Patent, heavily calcified Patent, heavily calcified  EIA Patent, heavily calcified Patent, heavily calcified  IIA Patent, heavily calcified Patent, heavily calcified  CFA Patent, heavily calcified Patent, heavily calcified  SFA Patent but diseased, occluded distally, widely patent SFA to AT bypass   PFA Patent   Pop Occluded   Trif Reconstitutes proximally via collaterals   AT Patent runoff into foot   Pero Proximally patent but diminishes distally   PT Proximally patent but diminishes distally      SPECIMEN(S):  none  INDICATIONS:   [name] [name] is a 79 y.o. male who presents with short distal claudication and intermittent rest pain in right leg.  On outside arterial duplex, the anterior tibial artery distal to the prior bypass was found to be occluded.  The patient presents for: right leg angiogram, possible intervention.  I discussed with the patient the nature of angiographic procedures, especially the limited patencies of any endovascular intervention.  The patient is aware of that the risks of an angiographic procedure include but are not limited to: bleeding, infection, access site complications, renal failure, embolization, rupture of vessel, dissection, possible need for emergent surgical intervention, possible need for surgical procedures to treat the patient's pathology,  and stroke and death.  The patient is aware of the risks and agrees to proceed.  DESCRIPTION: After full informed consent was obtained from the patient, the patient was brought back to the angiography suite.  The patient was placed supine upon the angiography table and connected to monitoring equipment.  The patient was then given conscious sedation, the amounts of which are documented in the patient's chart.  The patient was prepped and drape in the standard fashion for an angiographic procedure.  At this point, attention was turned to the left groin.  Under ultrasound guidance, I attempted to cannulate the left common femoral artery with a micropuncture needle.  The artery was so calcified, I could not puncture the artery.  Under ultrasound guidance, the left common femoral artery will be cannulated with a 18 gauge needle with some difficulty.  The [facility] wire was passed up into the aorta.  The needle was exchanged for a 5-Fr sheath, which was advanced over the wire into the common femoral artery.  The dilator was then removed.  The Omniflush catheter was then loaded over the wire up to the level of L1.  The catheter was connected to the power injector circuit.  After de-airring and de-clotting the circuit, a power injector aortogram was completed.  The [facility] wire was replaced in the catheter, and using the [place] and Omniflush catheter, I tried to select the right common iliac artery.  I could not maneuver the Omniflush catheter in this heavily calcified iliac arterial system and aorta.  I exchanged the Omniflush for a Crossover catheter, with which I selected the right common iliac artery with some difficulty.  It required trial of multiple wire and catheter  combinations to navigate the right iliac arterial system.  Eventually a glidewire and endhole catheter was able to get down to the common femoral artery.   The wire was removed and an automated right leg runoff was completed.  The findings are listed  above.  I had to repeat the injection in the mid-SFA to verify the SFA to AT bypass was patent proximally.  Based on the images, no intervention is needed.  I removed the catheter from the left sheath.  The sheath was aspirated.  No clots were present and the sheath was reloaded with heparinized saline.    COMPLICATIONS: none  CONDITION: stable  Leonides Sake, MD [facility] [place] Office: [phone] Pager: [phone]  01/06/2013, 11:25 AM

## 2013-01-06 NOTE — Telephone Encounter (Addendum)
Message copied by Fredrich Birks on Thu Jan 06, 2013  2:29 PM ------      Message from: [place], [place] K      Created: Thu Jan 06, 2013 12:30 PM      Regarding: schedule                   ----- Message -----         From: Melene Plan, RN         Sent: 01/06/2013  12:18 PM           To: Sharee Pimple, CMA, [facility]                        ----- Message -----         From: Fransisco Hertz, MD         Sent: 01/06/2013  12:16 PM           To: Melene Plan, RN            YEHUDA PRINTUP      [phone]      [birth year]            Dr. Hart Rochester said change follow up to 6 months with RLE arterial duplex for the SFA to AT bypass graft and BLE ABI ------  01/06/13: lm and mailed letter, dpm

## 2013-01-06 NOTE — H&P (View-Only) (Signed)
Subjective:     Patient ID: Chirstopher C [name], male   DOB: 12/21/1931, 79 y.o.   MRN: [id number]  HPI this 79-year-old male is known to me having previously undergone right proximal superficial femoral to anterior tibial bypass using saphenous vein from left leg in 2008 for severe claudication symptoms. Vein graft is functioning nicely over the last 6 years but he is now having severe claudication in the right calf and also some degree of rest discomfort which is atypical in the thigh and calf area. He was evaluated by Dr. Munley  who obtained an arterial duplex scan which revealed total occlusion of anterior tibial artery distal to the vein graft. He was referred for further evaluation. He denies any nonhealing ulcers or gangrene or infection in the right foot. He does have numbness in the right foot which is a chronic problem due to neuropathy.  Past Medical History  Diagnosis Date  . Diabetes mellitus   . Painful veins   . Peripheral arterial disease   . Hypertension   . Hyperlipidemia   . CAD (coronary artery disease)   . Cardiomyopathy     History  Substance Use Topics  . Smoking status: Former Smoker    Types: Cigarettes    Quit date: 01/04/1993  . Smokeless tobacco: Never Used  . Alcohol Use: No    Family History  Problem Relation Age of Onset  . Cancer Mother   . Diabetes Mother     No Known Allergies  Current outpatient prescriptions:aspirin 81 MG tablet, Take 81 mg by mouth daily., Disp: , Rfl: ;  carvedilol (COREG) 25 MG tablet, Take 25 mg by mouth 2 (two) times daily with a meal. Take 1 1/2 tablet every 12 hours, Disp: , Rfl: ;  enalapril (VASOTEC) 20 MG tablet, Take 20 mg by mouth daily.  , Disp: , Rfl: ;  fish oil-omega-3 fatty acids 1000 MG capsule, Take 2 g by mouth daily., Disp: , Rfl:  Metformin HCl 500 MG/5ML SOLN, Take by mouth.  , Disp: , Rfl: ;  nabumetone (RELAFEN) 750 MG tablet, Take 750 mg by mouth 2 (two) times daily., Disp: , Rfl: ;  niacin (NIASPAN) 500 MG  CR tablet, Take 500 mg by mouth at bedtime.  , Disp: , Rfl: ;  rosuvastatin (CRESTOR) 40 MG tablet, Take 40 mg by mouth daily., Disp: , Rfl: ;  atorvastatin (LIPITOR) 40 MG tablet, Take 40 mg by mouth daily.  , Disp: , Rfl:  nadolol (CORGARD) 80 MG tablet, Take 80 mg by mouth daily.  , Disp: , Rfl:   BP 159/86  Pulse 87  Resp 20  Ht 5' 9" (1.753 m)  Wt 223 lb (101.152 kg)  BMI 32.92 kg/m2  Body mass index is 32.92 kg/(m^2).         Review of Systems denies chest pain, dyspnea on exertion, PND, orthopnea, hemoptysis. Main problem is pain in right leg as outlined and numbness in right foot. Other systems is negative and complete review of symptoms    Objective:   Physical Exam BP 159/86  Pulse 87  Resp 20  Ht 5' 9" (1.753 m)  Wt 223 lb (101.152 kg)  BMI 32.92 kg/m2  Gen.-alert and oriented x3 in no apparent distress HEENT normal for age Lungs no rhonchi or wheezing Cardiovascular regular rhythm no murmurs carotid pulses 3+ palpable no bruits audible Abdomen soft nontender no palpable masses Musculoskeletal free of  major deformities Skin clear -no rashes Neurologic normal Lower extremities   3+ femoral pulses bilaterally. Right leg has a subcutaneously placed femoral to anterior tibial bypass which is easily visible beneath the skin-dilated-has an excellent pulse. No palpable pulses in the right foot. He does have intact motion and decreased sensation. Left leg has no palpable popliteal or distal pulses.  I reviewed the lower extremity arterial study report performed in Dr. Munley's office which revealed calcified tibial vessels on the right with noncompressible vessels. It appears that the anterior tibial artery is totally occluded distal to the functioning vein graft on the right.       Assessment:     Recurrent severe claudication and some degree of rest pain-atypical with functioning right femoral anterior tibial vein graft and severe tibial occlusive disease    Plan:      #1 we'll obtain upper Trinity vein mapping today in the office #2 schedule for angiogram per Dr. Chien on Thursday, November 6 to see if patient candidate for revision of femoral anterior tibial bypass      patient had bilateral upper extremity vein mapping in the office today which are reviewed and interpreted. He is left-handed. It appears that his right upper arm cephalic vein would be satisfactory for conduit if he needs a short segment of vein 

## 2013-01-07 LAB — GLUCOSE, CAPILLARY: Glucose-Capillary: 142 mg/dL — ABNORMAL HIGH (ref 70–99)

## 2013-01-13 ENCOUNTER — Ambulatory Visit (INDEPENDENT_AMBULATORY_CARE_PROVIDER_SITE_OTHER): Payer: Medicare Other

## 2013-01-13 VITALS — BP 146/76 | HR 71 | Resp 28 | Ht 69.0 in | Wt 223.0 lb

## 2013-01-13 DIAGNOSIS — Q828 Other specified congenital malformations of skin: Secondary | ICD-10-CM

## 2013-01-13 DIAGNOSIS — E1142 Type 2 diabetes mellitus with diabetic polyneuropathy: Secondary | ICD-10-CM

## 2013-01-13 DIAGNOSIS — E1149 Type 2 diabetes mellitus with other diabetic neurological complication: Secondary | ICD-10-CM

## 2013-01-13 DIAGNOSIS — E1140 Type 2 diabetes mellitus with diabetic neuropathy, unspecified: Secondary | ICD-10-CM

## 2013-01-13 DIAGNOSIS — M2040 Other hammer toe(s) (acquired), unspecified foot: Secondary | ICD-10-CM

## 2013-01-13 NOTE — Progress Notes (Signed)
  Subjective:    Patient ID: [name] [name], male    DOB: 12/17/1931, 79 y.o.   MRN: [id number] "I'm here to get my shoes." HPI No changes medication her health history at this time to   Review of Systems deferred at this visit     Objective:   Physical Exam Neurovascular status is intact and unchanged DP pulses are +2/4 bilateral PT pulses plus one over 4 bilateral. Capillary fill time 3 seconds all digits skin temperature warm turgor normal no edema noted epicritic sensations diminished on Semmes Weinstein testing to the forefoot and digits bilateral. Orthopedic biomechanical exam De Nurse for digital contractures associated keratoses which are being managed with the shoes and custom inlays. No new findings noted at this time. The shoes fit and contour well to the patient's foot the 2 pairs of shoes are dispensed and 3 pairs of dual density Plastizote inlays fit and contour well with full contact the patient's foot. 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 Patient has been wearing diabetic shoes replacing shoes are furnished at this time one pair of shoes 3 pairs of custom molded inlays. Instructions are given. Recommend followup in one month for continued palliative care as scheduled.  Were to [facility]

## 2013-01-13 NOTE — Patient Instructions (Signed)
Diabetes and Foot Care Diabetes may cause you to have problems because of poor blood supply (circulation) to your feet and legs. This may cause the skin on your feet to become thinner, break easier, and heal more slowly. Your skin may become dry, and the skin may peel and crack. You may also have nerve damage in your legs and feet causing decreased feeling in them. You may not notice minor injuries to your feet that could lead to infections or more serious problems. Taking care of your feet is one of the most important things you can do for yourself.  HOME CARE INSTRUCTIONS  Wear shoes at all times, even in the house. Do not go barefoot. Bare feet are easily injured.  Check your feet daily for blisters, cuts, and redness. If you cannot see the bottom of your feet, use a mirror or ask someone for help.  Wash your feet with warm water (do not use hot water) and mild soap. Then pat your feet and the areas between your toes until they are completely dry. Do not soak your feet as this can dry your skin.  Apply a moisturizing lotion or petroleum jelly (that does not contain alcohol and is unscented) to the skin on your feet and to dry, brittle toenails. Do not apply lotion between your toes.  Trim your toenails straight across. Do not dig under them or around the cuticle. File the edges of your nails with an emery board or nail file.  Do not cut corns or calluses or try to remove them with medicine.  Wear clean socks or stockings every day. Make sure they are not too tight. Do not wear knee-high stockings since they may decrease blood flow to your legs.  Wear shoes that fit properly and have enough cushioning. To break in new shoes, wear them for just a few hours a day. This prevents you from injuring your feet. Always look in your shoes before you put them on to be sure there are no objects inside.  Do not cross your legs. This may decrease the blood flow to your feet.  If you find a minor scrape,  cut, or break in the skin on your feet, keep it and the skin around it clean and dry. These areas may be cleansed with mild soap and water. Do not cleanse the area with peroxide, alcohol, or iodine.  When you remove an adhesive bandage, be sure not to damage the skin around it.  If you have a wound, look at it several times a day to make sure it is healing.  Do not use heating pads or hot water bottles. They may burn your skin. If you have lost feeling in your feet or legs, you may not know it is happening until it is too late.  Make sure your health care provider performs a complete foot exam at least annually or more often if you have foot problems. Report any cuts, sores, or bruises to your health care provider immediately. SEEK MEDICAL CARE IF:   You have an injury that is not healing.  You have cuts or breaks in the skin.  You have an ingrown nail.  You notice redness on your legs or feet.  You feel burning or tingling in your legs or feet.  You have pain or cramps in your legs and feet.  Your legs or feet are numb.  Your feet always feel cold. SEEK IMMEDIATE MEDICAL CARE IF:   There is increasing redness,   swelling, or pain in or around a wound.  There is a red line that goes up your leg.  Pus is coming from a wound.  You develop a fever or as directed by your health care provider.  You notice a bad smell coming from an ulcer or wound. Document Released: 02/15/2000 Document Revised: 10/20/2012 Document Reviewed: 07/27/2012 [employer] Patient Information 2014 [employer].  

## 2013-02-03 ENCOUNTER — Ambulatory Visit (INDEPENDENT_AMBULATORY_CARE_PROVIDER_SITE_OTHER): Payer: Medicare Other

## 2013-02-03 VITALS — BP 167/91 | HR 72 | Resp 18

## 2013-02-03 DIAGNOSIS — L608 Other nail disorders: Secondary | ICD-10-CM

## 2013-02-03 DIAGNOSIS — E1142 Type 2 diabetes mellitus with diabetic polyneuropathy: Secondary | ICD-10-CM

## 2013-02-03 DIAGNOSIS — E1140 Type 2 diabetes mellitus with diabetic neuropathy, unspecified: Secondary | ICD-10-CM

## 2013-02-03 DIAGNOSIS — E1149 Type 2 diabetes mellitus with other diabetic neurological complication: Secondary | ICD-10-CM

## 2013-02-03 NOTE — Progress Notes (Signed)
° °  Subjective:    Patient ID: [name] [name], male    DOB: Nov 01, 1931, 79 y.o.   MRN: [id number]  HPI My shoes feels pretty good and I need my toenails trimmed    Review of Systems     Objective:   Physical Exam        Assessment & Plan:

## 2013-02-03 NOTE — Patient Instructions (Signed)
Diabetes and Foot Care Diabetes may cause you to have problems because of poor blood supply (circulation) to your feet and legs. This may cause the skin on your feet to become thinner, break easier, and heal more slowly. Your skin may become dry, and the skin may peel and crack. You may also have nerve damage in your legs and feet causing decreased feeling in them. You may not notice minor injuries to your feet that could lead to infections or more serious problems. Taking care of your feet is one of the most important things you can do for yourself.  HOME CARE INSTRUCTIONS  Wear shoes at all times, even in the house. Do not go barefoot. Bare feet are easily injured.  Check your feet daily for blisters, cuts, and redness. If you cannot see the bottom of your feet, use a mirror or ask someone for help.  Wash your feet with warm water (do not use hot water) and mild soap. Then pat your feet and the areas between your toes until they are completely dry. Do not soak your feet as this can dry your skin.  Apply a moisturizing lotion or petroleum jelly (that does not contain alcohol and is unscented) to the skin on your feet and to dry, brittle toenails. Do not apply lotion between your toes.  Trim your toenails straight across. Do not dig under them or around the cuticle. File the edges of your nails with an emery board or nail file.  Do not cut corns or calluses or try to remove them with medicine.  Wear clean socks or stockings every day. Make sure they are not too tight. Do not wear knee-high stockings since they may decrease blood flow to your legs.  Wear shoes that fit properly and have enough cushioning. To break in new shoes, wear them for just a few hours a day. This prevents you from injuring your feet. Always look in your shoes before you put them on to be sure there are no objects inside.  Do not cross your legs. This may decrease the blood flow to your feet.  If you find a minor scrape,  cut, or break in the skin on your feet, keep it and the skin around it clean and dry. These areas may be cleansed with mild soap and water. Do not cleanse the area with peroxide, alcohol, or iodine.  When you remove an adhesive bandage, be sure not to damage the skin around it.  If you have a wound, look at it several times a day to make sure it is healing.  Do not use heating pads or hot water bottles. They may burn your skin. If you have lost feeling in your feet or legs, you may not know it is happening until it is too late.  Make sure your health care provider performs a complete foot exam at least annually or more often if you have foot problems. Report any cuts, sores, or bruises to your health care provider immediately. SEEK MEDICAL CARE IF:   You have an injury that is not healing.  You have cuts or breaks in the skin.  You have an ingrown nail.  You notice redness on your legs or feet.  You feel burning or tingling in your legs or feet.  You have pain or cramps in your legs and feet.  Your legs or feet are numb.  Your feet always feel cold. SEEK IMMEDIATE MEDICAL CARE IF:   There is increasing redness,   swelling, or pain in or around a wound.  There is a red line that goes up your leg.  Pus is coming from a wound.  You develop a fever or as directed by your health care provider.  You notice a bad smell coming from an ulcer or wound. Document Released: 02/15/2000 Document Revised: 10/20/2012 Document Reviewed: 07/27/2012 [employer] Patient Information 2014 [employer].  

## 2013-02-03 NOTE — Progress Notes (Signed)
   Subjective:    Patient ID: [name] [name], male    DOB: Dec 10, 1931, 79 y.o.   MRN: [id number]  HPI patient presents this time for diabetic foot and nail care as well as shoe check and evaluation. No changes medication her health history. The shoes are working well he fit and contour well.    Review of Systems  Constitutional: Negative.   Cardiovascular: Negative.   Gastrointestinal: Negative.   Endocrine: Negative.   Musculoskeletal: Negative.   Skin: Negative.   Neurological: Negative.   Hematological: Negative.   Psychiatric/Behavioral: Negative.   All other systems reviewed and are negative.       Objective:   Physical Exam Neurovascular status is intact as follows DP +2/4 bilateral PT thready at one over 4 bilateral. Refill time 3 seconds all digits. Skin temperature warm turgor normal no edema rubor pallor mild varicosities noted. Neurologically epicritic and proprioceptive sensations diminished on Semmes Weinstein testing to forefoot digits bilateral. Nails thick brittle crumbly friable and discolored 1 through 5 bilateral. There is a small skin tag lateral nail fold right hallux nonpainful or symptomatic and present for many years. There is some diffuse keratoses sub-5 bilateral no active wounds ulcerations or the knee difficulties noted. Semirigid digital contractures 2 through 5 adductovarus rotation..       Assessment & Plan:  Diabetes with history peripheral neuropathy. Dystrophic probably mycotic nails consistent with onychomycosis are debrided x10 the presence of diabetes and complicating factors return for future followup as needed suggest 3 month followup for palliative care  Alvan Dame DPM

## 2013-05-05 ENCOUNTER — Ambulatory Visit (INDEPENDENT_AMBULATORY_CARE_PROVIDER_SITE_OTHER): Payer: Medicare Other

## 2013-05-05 VITALS — BP 95/45 | HR 94 | Resp 16

## 2013-05-05 DIAGNOSIS — Q828 Other specified congenital malformations of skin: Secondary | ICD-10-CM

## 2013-05-05 DIAGNOSIS — L608 Other nail disorders: Secondary | ICD-10-CM

## 2013-05-05 DIAGNOSIS — E1140 Type 2 diabetes mellitus with diabetic neuropathy, unspecified: Secondary | ICD-10-CM

## 2013-05-05 DIAGNOSIS — E1142 Type 2 diabetes mellitus with diabetic polyneuropathy: Secondary | ICD-10-CM

## 2013-05-05 DIAGNOSIS — E1149 Type 2 diabetes mellitus with other diabetic neurological complication: Secondary | ICD-10-CM

## 2013-05-05 NOTE — Patient Instructions (Signed)
Diabetes and Foot Care Diabetes may cause you to have problems because of poor blood supply (circulation) to your feet and legs. This may cause the skin on your feet to become thinner, break easier, and heal more slowly. Your skin may become dry, and the skin may peel and crack. You may also have nerve damage in your legs and feet causing decreased feeling in them. You may not notice minor injuries to your feet that could lead to infections or more serious problems. Taking care of your feet is one of the most important things you can do for yourself.  HOME CARE INSTRUCTIONS  Wear shoes at all times, even in the house. Do not go barefoot. Bare feet are easily injured.  Check your feet daily for blisters, cuts, and redness. If you cannot see the bottom of your feet, use a mirror or ask someone for help.  Wash your feet with warm water (do not use hot water) and mild soap. Then pat your feet and the areas between your toes until they are completely dry. Do not soak your feet as this can dry your skin.  Apply a moisturizing lotion or petroleum jelly (that does not contain alcohol and is unscented) to the skin on your feet and to dry, brittle toenails. Do not apply lotion between your toes.  Trim your toenails straight across. Do not dig under them or around the cuticle. File the edges of your nails with an emery board or nail file.  Do not cut corns or calluses or try to remove them with medicine.  Wear clean socks or stockings every day. Make sure they are not too tight. Do not wear knee-high stockings since they may decrease blood flow to your legs.  Wear shoes that fit properly and have enough cushioning. To break in new shoes, wear them for just a few hours a day. This prevents you from injuring your feet. Always look in your shoes before you put them on to be sure there are no objects inside.  Do not cross your legs. This may decrease the blood flow to your feet.  If you find a minor scrape,  cut, or break in the skin on your feet, keep it and the skin around it clean and dry. These areas may be cleansed with mild soap and water. Do not cleanse the area with peroxide, alcohol, or iodine.  When you remove an adhesive bandage, be sure not to damage the skin around it.  If you have a wound, look at it several times a day to make sure it is healing.  Do not use heating pads or hot water bottles. They may burn your skin. If you have lost feeling in your feet or legs, you may not know it is happening until it is too late.  Make sure your health care provider performs a complete foot exam at least annually or more often if you have foot problems. Report any cuts, sores, or bruises to your health care provider immediately. SEEK MEDICAL CARE IF:   You have an injury that is not healing.  You have cuts or breaks in the skin.  You have an ingrown nail.  You notice redness on your legs or feet.  You feel burning or tingling in your legs or feet.  You have pain or cramps in your legs and feet.  Your legs or feet are numb.  Your feet always feel cold. SEEK IMMEDIATE MEDICAL CARE IF:   There is increasing redness,   swelling, or pain in or around a wound.  There is a red line that goes up your leg.  Pus is coming from a wound.  You develop a fever or as directed by your health care provider.  You notice a bad smell coming from an ulcer or wound. Document Released: 02/15/2000 Document Revised: 10/20/2012 Document Reviewed: 07/27/2012 [employer] Patient Information 2014 [employer].  

## 2013-05-05 NOTE — Progress Notes (Signed)
   Subjective:    Patient ID: [name] [name], male    DOB: 08-12-1931, 80 y.o.   MRN: [id number]  HPI I just need my 3 month nail trim and no medicine and no change in health    Review of Systems no new changes or fine     Objective:   Physical Exam Neurovascular status is intact with pedal pulses palpable DP and PT +2/4 bilateral capillary refill time 3 seconds temperature is warm turgor normal there is no edema noted nails thick brittle hypertrophic friable gratified 1 through 5 bilateral. There is some bleeding noted on the hallux and third digits following the hallux right third digit left following debridement he treated with lumicain Neosporin. At this time there is some mild keratoses pinch callus first bilateral however these are minimal no debridement was necessary at this time may address the future date again. Patient wearing appropriate extra-depth shoes her diabetic shoes and coming shoes as instructed. No open wounds ulcerations no secondary infections neurologically epicritic sensations diminished on Semmes Weinstein testing confirmed absent sensation of the toes and plantar forefoot.      Assessment & Plan:  Assessment diabetes with neuropathy thick dystrophic friable gratified thick nails painful in systematic hallux second third fourth and fifth bilateral are debrided and the presence of diabetes and complications return for future palliative care is needed suggest 3 month followup.  [name] [name]

## 2013-07-18 ENCOUNTER — Encounter: Payer: Self-pay | Admitting: Vascular Surgery

## 2013-07-19 ENCOUNTER — Ambulatory Visit: Payer: Medicare Other | Admitting: Vascular Surgery

## 2013-07-19 ENCOUNTER — Other Ambulatory Visit (HOSPITAL_COMMUNITY): Payer: Medicare Other

## 2013-07-19 ENCOUNTER — Encounter (HOSPITAL_COMMUNITY): Payer: Medicare Other

## 2013-08-04 ENCOUNTER — Ambulatory Visit (INDEPENDENT_AMBULATORY_CARE_PROVIDER_SITE_OTHER): Payer: Medicare Other

## 2013-08-04 VITALS — BP 142/74 | HR 90 | Resp 12

## 2013-08-04 DIAGNOSIS — E1140 Type 2 diabetes mellitus with diabetic neuropathy, unspecified: Secondary | ICD-10-CM

## 2013-08-04 DIAGNOSIS — L608 Other nail disorders: Secondary | ICD-10-CM

## 2013-08-04 DIAGNOSIS — Q828 Other specified congenital malformations of skin: Secondary | ICD-10-CM

## 2013-08-04 DIAGNOSIS — E1149 Type 2 diabetes mellitus with other diabetic neurological complication: Secondary | ICD-10-CM

## 2013-08-04 DIAGNOSIS — I739 Peripheral vascular disease, unspecified: Secondary | ICD-10-CM

## 2013-08-04 NOTE — Patient Instructions (Signed)
Diabetes and Foot Care Diabetes may cause you to have problems because of poor blood supply (circulation) to your feet and legs. This may cause the skin on your feet to become thinner, break easier, and heal more slowly. Your skin may become dry, and the skin may peel and crack. You may also have nerve damage in your legs and feet causing decreased feeling in them. You may not notice minor injuries to your feet that could lead to infections or more serious problems. Taking care of your feet is one of the most important things you can do for yourself.  HOME CARE INSTRUCTIONS  Wear shoes at all times, even in the house. Do not go barefoot. Bare feet are easily injured.  Check your feet daily for blisters, cuts, and redness. If you cannot see the bottom of your feet, use a mirror or ask someone for help.  Wash your feet with warm water (do not use hot water) and mild soap. Then pat your feet and the areas between your toes until they are completely dry. Do not soak your feet as this can dry your skin.  Apply a moisturizing lotion or petroleum jelly (that does not contain alcohol and is unscented) to the skin on your feet and to dry, brittle toenails. Do not apply lotion between your toes.  Trim your toenails straight across. Do not dig under them or around the cuticle. File the edges of your nails with an emery board or nail file.  Do not cut corns or calluses or try to remove them with medicine.  Wear clean socks or stockings every day. Make sure they are not too tight. Do not wear knee-high stockings since they may decrease blood flow to your legs.  Wear shoes that fit properly and have enough cushioning. To break in new shoes, wear them for just a few hours a day. This prevents you from injuring your feet. Always look in your shoes before you put them on to be sure there are no objects inside.  Do not cross your legs. This may decrease the blood flow to your feet.  If you find a minor scrape,  cut, or break in the skin on your feet, keep it and the skin around it clean and dry. These areas may be cleansed with mild soap and water. Do not cleanse the area with peroxide, alcohol, or iodine.  When you remove an adhesive bandage, be sure not to damage the skin around it.  If you have a wound, look at it several times a day to make sure it is healing.  Do not use heating pads or hot water bottles. They may burn your skin. If you have lost feeling in your feet or legs, you may not know it is happening until it is too late.  Make sure your health care provider performs a complete foot exam at least annually or more often if you have foot problems. Report any cuts, sores, or bruises to your health care provider immediately. SEEK MEDICAL CARE IF:   You have an injury that is not healing.  You have cuts or breaks in the skin.  You have an ingrown nail.  You notice redness on your legs or feet.  You feel burning or tingling in your legs or feet.  You have pain or cramps in your legs and feet.  Your legs or feet are numb.  Your feet always feel cold. SEEK IMMEDIATE MEDICAL CARE IF:   There is increasing redness,   swelling, or pain in or around a wound.  There is a red line that goes up your leg.  Pus is coming from a wound.  You develop a fever or as directed by your health care provider.  You notice a bad smell coming from an ulcer or wound. Document Released: 02/15/2000 Document Revised: 10/20/2012 Document Reviewed: 07/27/2012 [employer] Patient Information 2014 [employer].  

## 2013-08-04 NOTE — Progress Notes (Signed)
   Subjective:    Patient ID: [name] [name], male    DOB: [birth year], 80 y.o.   MRN: [id number]  HPI   TOENAILS TRIM.   Review of Systems no new findings or systemic changes noted    Objective:   Physical Exam Neurovascular status is intact with pedal pulses palpable DP and PT +2/4 capillary refill time 3 seconds there is warm temperature turgor normal no edema rubor mild varicosities noted nails thick hypertrophic friable 1 through 5 bilateral debridement of the right hallux after debridement treated with lumicain and Neosporin. Patient this time nails debrided there is requesting a diabetic accident shoes history shoes are worn breaking down been unsuccessful preventing is keratoses which have an issue in the past. At this time we'll obtain authorization for new diabetic accident shoes patient reported the future as needed for followup for palliative care nails thick brittle crumbly friable are debrided patient does have digital contractures hammertoe deformities 2 through 5 side of flexible no open sores no ulcerations no secondary infection is noted there is decreased sensation Semmes Weinstein testing to forefoot digits consistent with diabetic neuropathy      Assessment & Plan:  Diabetes with neuropathy thick dystrophic friable gratified nails painful tender symptomatic nails hallux second third fourth and fifth digits bilateral are debrided and the presence of diabetes return for future palliative care is needed if his diabetic shoe authorization for a replacement  extra-depth shoe shoes to place the old worn-out pair this time. Recheck in 3 months for followup and continued palliative care next progress Alvan Dame DPM

## 2013-08-29 ENCOUNTER — Encounter: Payer: Self-pay | Admitting: Vascular Surgery

## 2013-08-30 ENCOUNTER — Telehealth: Payer: Self-pay | Admitting: Vascular Surgery

## 2013-08-30 ENCOUNTER — Ambulatory Visit (INDEPENDENT_AMBULATORY_CARE_PROVIDER_SITE_OTHER): Payer: Medicare Other | Admitting: Family

## 2013-08-30 ENCOUNTER — Encounter: Payer: Self-pay | Admitting: Family

## 2013-08-30 ENCOUNTER — Ambulatory Visit (HOSPITAL_COMMUNITY)
Admission: RE | Admit: 2013-08-30 | Discharge: 2013-08-30 | Disposition: A | Discharge status: Home / Self Care | Payer: Medicare Other | Source: Ambulatory Visit | Attending: Family | Admitting: Family

## 2013-08-30 ENCOUNTER — Ambulatory Visit (INDEPENDENT_AMBULATORY_CARE_PROVIDER_SITE_OTHER)
Admission: RE | Admit: 2013-08-30 | Discharge: 2013-08-30 | Disposition: A | Discharge status: Home / Self Care | Payer: Medicare Other | Source: Ambulatory Visit | Attending: Vascular Surgery | Admitting: Vascular Surgery

## 2013-08-30 VITALS — BP 149/75 | HR 70 | Resp 16 | Ht 70.5 in | Wt 228.0 lb

## 2013-08-30 DIAGNOSIS — M79609 Pain in unspecified limb: Secondary | ICD-10-CM

## 2013-08-30 DIAGNOSIS — R42 Dizziness and giddiness: Secondary | ICD-10-CM

## 2013-08-30 DIAGNOSIS — R29898 Other symptoms and signs involving the musculoskeletal system: Secondary | ICD-10-CM | POA: Insufficient documentation

## 2013-08-30 DIAGNOSIS — Z48812 Encounter for surgical aftercare following surgery on the circulatory system: Secondary | ICD-10-CM | POA: Insufficient documentation

## 2013-08-30 DIAGNOSIS — M79605 Pain in left leg: Secondary | ICD-10-CM | POA: Insufficient documentation

## 2013-08-30 DIAGNOSIS — I739 Peripheral vascular disease, unspecified: Secondary | ICD-10-CM

## 2013-08-30 NOTE — Progress Notes (Addendum)
[facility] [place] HISTORY AND PHYSICAL -PAD  History of Present Illness [name] BarkerJames C Bermea is a 80 y.o. male patient of Dr. Hart RochesterLawson who is s/p Right superficial femoral to anterior tibial artery bypass graft placed 2008. He returns today for follow up and C/O Right thigh / leg pain : Right > Left leg, duration 1 + yrs. Weakness both legs. He walks about 20 feet and both calves hurt, relieved by rest, denies non healing wounds. He denies any history of stroke or TIA. Her has arthritis in his right knee, does not walk much.  Pt. denies rest pain; The patient reports New Medical or Surgical History: is under evaluation for right knee replacement; he discussed this possibility and the possible vascular implications with Dr. Hart RochesterLawson today  Pt Diabetic: Yes, states in good control Pt smoker: former smoker, quit in 1989  Pt meds include: Statin :Yes Betablocker: Yes ASA: Yes Other anticoagulants/antiplatelets: no  Past Medical History  Diagnosis Date  . Diabetes mellitus   . Painful veins   . Peripheral arterial disease   . Hypertension   . Hyperlipidemia   . CAD (coronary artery disease)   . Cardiomyopathy     Social History History  Substance Use Topics  . Smoking status: Former Smoker    Types: Cigarettes    Quit date: 01/04/1993  . Smokeless tobacco: Never Used  . Alcohol Use: No    Family History Family History  Problem Relation Age of Onset  . Cancer Mother   . Diabetes Mother     Past Surgical History  Procedure Laterality Date  . Open heart surgery    . Coronary artery bypass graft      No Known Allergies  Current Outpatient Prescriptions  Medication Sig Dispense Refill  . amLODipine (NORVASC) 5 MG tablet       . aspirin 325 MG tablet Take 325 mg by mouth daily.      . carvedilol (COREG) 25 MG tablet Take 25 mg by mouth 2 (two) times daily with a meal. Take 1 1/2 tablet every 12 hours      . enalapril (VASOTEC) 20 MG tablet Take 20 mg  by mouth 2 (two) times daily.       . fish oil-omega-3 fatty acids 1000 MG capsule Take 1 g by mouth 2 (two) times daily.       . meloxicam (MOBIC) 7.5 MG tablet       . metFORMIN (GLUCOPHAGE) 1000 MG tablet Take 1,000 mg by mouth 2 (two) times daily with a meal.      . niacin (SLO-NIACIN) 500 MG tablet Take 500 mg by mouth 2 (two) times daily.      . pravastatin (PRAVACHOL) 10 MG tablet       . amoxicillin-clavulanate (AUGMENTIN) 875-125 MG per tablet       . aspirin EC 81 MG tablet Take 81 mg by mouth daily.      Marland Kitchen. atorvastatin (LIPITOR) 40 MG tablet Take 40 mg by mouth at bedtime.       Marland Kitchen. azithromycin (ZITHROMAX) 500 MG tablet       . nabumetone (RELAFEN) 750 MG tablet Take 750 mg by mouth 2 (two) times daily.      . nadolol (CORGARD) 80 MG tablet Take 80 mg by mouth daily.        . rosuvastatin (CRESTOR) 40 MG tablet Take 40 mg by mouth at bedtime.       . tamsulosin (FLOMAX) 0.4 MG  CAPS capsule       . VOLTAREN 1 % GEL        No current facility-administered medications for this visit.    ROS: See HPI for pertinent positives and negatives.   Physical Examination   Filed Vitals:   08/30/13 1237  BP: 149/75  Pulse: 70  Resp: 16  Height: 5' 10.5" (1.791 m)  Weight: 228 lb (103.42 kg)  SpO2: 96%   Body mass index is 32.24 kg/(m^2).  General: A&O x 3, WDWN, obese male. Gait: limp, using cane Eyes: PERRLA. Pulmonary: CTAB, without wheezes , rales or rhonchi. Cardiac: regular Rythm , without detected murmur.         Carotid Bruits Left Right   Negative Negative  Aorta is not palpable. Radial pulses: 2+ palpable and equal                           VASCULAR EXAM: Extremities without ischemic changes  without Gangrene; without open wounds.                                                                                                          LE Pulses LEFT RIGHT       FEMORAL  faintly palpable  faintly palpable        POPLITEAL  not palpable   not palpable        POSTERIOR TIBIAL  not palpable   not palpable        DORSALIS PEDIS      ANTERIOR TIBIAL not palpable  not palpable    Abdomen: soft, NT, no masses, large panus. Skin: no rashes, no ulcers noted. Musculoskeletal: no muscle wasting or atrophy. 2+ bilateral pretibial pitting edema.  Neurologic: A&O X 3; Appropriate Affect ; SENSATION: normal; MOTOR FUNCTION:  moving all extremities equally, motor strength 5/5 in upper extremities, 3/5 in lower extremities. Speech is fluent/normal. CN 2-12 grossly intact.    Non-Invasive Vascular Imaging: DATE: 08/30/2013 LOWER EXTREMITY ARTERIAL DUPLEX EVALUATION    INDICATION: Peripheral Vascular Disease     PREVIOUS INTERVENTION(S): Right superficial femoral to anterior tibial artery bypass graft placed 2008.    DUPLEX EXAM:     RIGHT  LEFT   Peak Systolic Velocity (cm/s) Ratio (if abnormal) Waveform  Peak Systolic Velocity (cm/s) Ratio (if abnormal) Waveform  131  T Inflow Artery     103  T Proximal Anastomosis     57  T Proximal Graft     41  T Mid Graft     40  T  Distal Graft     61  T Distal Anastomosis     94/93/114  T/T/T Outflow Artery     N/C-0.88 Today's ABI / TBI N/C-0.27  1.50 Previous ABI / TBI (11/16/2012 [facility]  ) 0.91    Waveform:    M - Monophasic       B - Biphasic       T - Triphasic  If Ankle Brachial Index (ABI) or Toe Brachial Index (TBI)  performed, please see complete report     ADDITIONAL FINDINGS:     IMPRESSION: Elevated velocities greater than 200cm/sec with heterogeneous plaque present suggestive of greater than 50% stenosis present involving the right common femoral artery, proximal profunda femoral, and proximal superficial femoral arteries. Patent right proximal superficial femoral to proximal anterior tibial artery bypass graft, no hemodynamically significant stenosis present.    Compared to the previous exam:  No previous [facility] [address] study to compare.    ASSESSMENT: [name] [name] is a 80  y.o. male who is s/p Right superficial femoral to anterior tibial artery bypass graft placed 2008. He has difficulty walking due to what appears to be a combination of OA pain and claudication in both lower legs. ABI's are not measurable due to non compressible bilateral tibial artery calcification. Right toe brachial index is normal, left is below normal. Elevated velocities greater than 200cm/sec with heterogeneous plaque present suggestive of greater than 50% stenosis present involving the right common femoral artery, proximal profunda femoral, and proximal superficial femoral arteries. Patent right proximal superficial femoral to proximal anterior tibial artery bypass graft, no hemodynamically significant stenosis present. At Dr. Hart RochesterLawson suggestion will check carotid Duplex at the next available non invasive lab and NP available date since patient complains of episodes of light headedness.  PLAN:  Chair exercises for legs discussed and demonstrated, perform several times/day since his walking is limited by OA and claudication pain. I discussed in depth with the patient the nature of atherosclerosis, and emphasized the importance of maximal medical management including strict control of blood pressure, blood glucose, and lipid levels, obtaining regular exercise, and continued cessation of smoking.  The patient is aware that without maximal medical management the underlying atherosclerotic disease process will progress, limiting the benefit of any interventions.  Based on the patient's vascular studies and examination, pt will return to clinic on July 31 for carotid Duplex, follow up with NP, and in 6 months for ABI's and right LE arterial Duplex, see Dr. Hart RochesterLawson.  The patient was given information about PAD including signs, symptoms, treatment, what symptoms should prompt the patient to seek immediate medical care, and risk reduction measures to take.  Charisse MarchSuzanne Nickel, RN, MSN, FNP-C [facility] [place] Office Phone: [phone]  Clinic MD: Hart RochesterLawson  08/30/2013 12:51 PM

## 2013-08-30 NOTE — Telephone Encounter (Signed)
Spoke with Bonita QuinLinda who was with pt during visit. Notified her of date and time of f/u appointment 09/30/13 2:30 pm and 03/07/14 at 12:30 pm. She verbalized understanding and would let patient know - kf

## 2013-08-30 NOTE — Patient Instructions (Signed)

## 2013-09-29 ENCOUNTER — Encounter: Payer: Self-pay | Admitting: Family

## 2013-09-30 ENCOUNTER — Encounter: Payer: Self-pay | Admitting: Family

## 2013-09-30 ENCOUNTER — Ambulatory Visit (HOSPITAL_COMMUNITY)
Admission: RE | Admit: 2013-09-30 | Discharge: 2013-09-30 | Disposition: A | Discharge status: Home / Self Care | Payer: Medicare Other | Source: Ambulatory Visit | Attending: Family | Admitting: Family

## 2013-09-30 ENCOUNTER — Ambulatory Visit (INDEPENDENT_AMBULATORY_CARE_PROVIDER_SITE_OTHER): Payer: Medicare Other | Admitting: Family

## 2013-09-30 VITALS — BP 116/70 | HR 75 | Resp 16 | Ht 70.0 in | Wt 232.0 lb

## 2013-09-30 DIAGNOSIS — Z48812 Encounter for surgical aftercare following surgery on the circulatory system: Secondary | ICD-10-CM | POA: Diagnosis not present

## 2013-09-30 DIAGNOSIS — I739 Peripheral vascular disease, unspecified: Secondary | ICD-10-CM

## 2013-09-30 DIAGNOSIS — R42 Dizziness and giddiness: Secondary | ICD-10-CM

## 2013-09-30 NOTE — Progress Notes (Signed)
[facility] [place] HISTORY AND PHYSICAL   MRN : [id number]  History of Present Illness:   [name] [name] is a 80 y.o. male patient of Dr. Hart RochesterLawson who is s/p right superficial femoral to anterior tibial artery bypass graft placed 2008.  He returns today at Dr. Hart RochesterLawson suggestion at patient's June, 2015 visit since patient complained of episodes of light headedness. Pt reports that his PCP cleaned out his right ear and the dizziness resolved.   He walks about 20 feet and both calves hurt, relieved by rest, denies non healing wounds.  He denies any history of stroke or TIA.  Her has arthritis in his right knee, does not walk much.  Pt. denies rest pain;  The patient reports New Medical or Surgical History: is under evaluation for right knee replacement; he discussed this possibility and the possible vascular implications with Dr. Hart RochesterLawson at his June, 2015 visit.  Pt Diabetic: Yes, states in good control  Pt smoker: former smoker, quit in 1989   Pt meds include:  Statin :Yes  Betablocker: Yes  ASA: Yes  Other anticoagulants/antiplatelets: no   Current Outpatient Prescriptions  Medication Sig Dispense Refill  . amLODipine (NORVASC) 5 MG tablet       . aspirin 325 MG tablet Take 325 mg by mouth daily.      Marland Kitchen. azithromycin (ZITHROMAX) 500 MG tablet       . carvedilol (COREG) 25 MG tablet Take 25 mg by mouth 2 (two) times daily with a meal. Take 1 1/2 tablet every 12 hours      . enalapril (VASOTEC) 20 MG tablet Take 20 mg by mouth 2 (two) times daily.       . fish oil-omega-3 fatty acids 1000 MG capsule Take 1 g by mouth 2 (two) times daily.       . meloxicam (MOBIC) 7.5 MG tablet       . metFORMIN (GLUCOPHAGE) 1000 MG tablet Take 1,000 mg by mouth 2 (two) times daily with a meal.      . nabumetone (RELAFEN) 750 MG tablet Take 750 mg by mouth 2 (two) times daily.      . nadolol (CORGARD) 80 MG tablet Take 80 mg by mouth daily.        . pravastatin (PRAVACHOL) 10 MG  tablet       . tamsulosin (FLOMAX) 0.4 MG CAPS capsule       . VOLTAREN 1 % GEL       . amoxicillin-clavulanate (AUGMENTIN) 875-125 MG per tablet       . aspirin EC 81 MG tablet Take 81 mg by mouth daily.      Marland Kitchen. atorvastatin (LIPITOR) 40 MG tablet Take 40 mg by mouth at bedtime.       . niacin (SLO-NIACIN) 500 MG tablet Take 500 mg by mouth 2 (two) times daily.      . rosuvastatin (CRESTOR) 40 MG tablet Take 40 mg by mouth at bedtime.        No current facility-administered medications for this visit.    Past Medical History  Diagnosis Date  . Diabetes mellitus   . Painful veins   . Peripheral arterial disease   . Hypertension   . Hyperlipidemia   . CAD (coronary artery disease)   . Cardiomyopathy     Social History History  Substance Use Topics  . Smoking status: Former Smoker    Types: Cigarettes    Quit date: 01/04/1993  . Smokeless tobacco:  Never Used  . Alcohol Use: No    Family History Family History  Problem Relation Age of Onset  . Cancer Mother   . Diabetes Mother     Surgical History Past Surgical History  Procedure Laterality Date  . Open heart surgery    . Coronary artery bypass graft      No Known Allergies  Current Outpatient Prescriptions  Medication Sig Dispense Refill  . amLODipine (NORVASC) 5 MG tablet       . aspirin 325 MG tablet Take 325 mg by mouth daily.      Marland Kitchen azithromycin (ZITHROMAX) 500 MG tablet       . carvedilol (COREG) 25 MG tablet Take 25 mg by mouth 2 (two) times daily with a meal. Take 1 1/2 tablet every 12 hours      . enalapril (VASOTEC) 20 MG tablet Take 20 mg by mouth 2 (two) times daily.       . fish oil-omega-3 fatty acids 1000 MG capsule Take 1 g by mouth 2 (two) times daily.       . meloxicam (MOBIC) 7.5 MG tablet       . metFORMIN (GLUCOPHAGE) 1000 MG tablet Take 1,000 mg by mouth 2 (two) times daily with a meal.      . nabumetone (RELAFEN) 750 MG tablet Take 750 mg by mouth 2 (two) times daily.      . nadolol  (CORGARD) 80 MG tablet Take 80 mg by mouth daily.        . pravastatin (PRAVACHOL) 10 MG tablet       . tamsulosin (FLOMAX) 0.4 MG CAPS capsule       . VOLTAREN 1 % GEL       . amoxicillin-clavulanate (AUGMENTIN) 875-125 MG per tablet       . aspirin EC 81 MG tablet Take 81 mg by mouth daily.      Marland Kitchen atorvastatin (LIPITOR) 40 MG tablet Take 40 mg by mouth at bedtime.       . niacin (SLO-NIACIN) 500 MG tablet Take 500 mg by mouth 2 (two) times daily.      . rosuvastatin (CRESTOR) 40 MG tablet Take 40 mg by mouth at bedtime.        No current facility-administered medications for this visit.     REVIEW OF SYSTEMS: See HPI for pertinent positives and negatives.  Physical Examination Filed Vitals:   09/30/13 1546  BP: 116/70  Pulse: 75  Resp: 16  Height: 5\' 10"  (1.778 m)  Weight: 232 lb (105.235 kg)  SpO2: 96%   Body mass index is 33.29 kg/(m^2).  General: A&O x 3, WDWN, obese male.  Gait: limp, using cane  Eyes: PERRLA.  Pulmonary: CTAB, without wheezes , rales or rhonchi.  Cardiac: regular Rythm , without detected murmur.  Carotid Bruits  Left  Right    Negative  Negative   Aorta is not palpable.  Radial pulses: 2+ palpable and equal  VASCULAR EXAM:  Extremities without ischemic changes  without Gangrene; without open wounds.  LE Pulses  LEFT  RIGHT   FEMORAL  faintly palpable  faintly palpable   POPLITEAL  not palpable  not palpable   POSTERIOR TIBIAL  not palpable  not palpable   DORSALIS PEDIS  ANTERIOR TIBIAL   2+palpable   2+palpable   Abdomen: soft, NT, no masses, large panus.  Skin: no rashes, no ulcers noted.  Musculoskeletal: no muscle wasting or atrophy. 2+ bilateral pretibial pitting edema.  Neurologic:  A&O X 3; Appropriate Affect ; SENSATION: normal; MOTOR FUNCTION: moving all extremities equally, motor strength 5/5 in upper extremities, 3/5 in lower extremities. Speech is fluent/normal. CN 2-12 grossly intact.   Non-Invasive Vascular Imaging:  (09/30/2013) CEREBROVASCULAR DUPLEX EVALUATION    INDICATION: Dizziness and light-headedness with history of vascular disease    PREVIOUS INTERVENTION(S):     DUPLEX EXAM:     RIGHT  LEFT  Peak Systolic Velocities (cm/s) End Diastolic Velocities (cm/s) Plaque LOCATION Peak Systolic Velocities (cm/s) End Diastolic Velocities (cm/s) Plaque  133 14  CCA PROXIMAL 240 24   97 16  CCA MID 144 18   73 12  CCA DISTAL 73 14 HT  118 12 HT ECA 106 10 HT  64 13 CP ICA PROXIMAL 30 9 CP  145 24  ICA MID 50 11   90 22  ICA DISTAL 149 33     .88 ICA / CCA Ratio (PSV) .41  Antegrade  Vertebral Flow Antegrade   138 Brachial Systolic Pressure (mmHg) 140  Within normal limits  Brachial Artery Waveforms Within normal limits     Plaque Morphology:  HM = Homogeneous, HT = Heterogeneous, CP = Calcific Plaque, SP = Smooth Plaque, IP = Irregular Plaque     ADDITIONAL FINDINGS: Technically difficult study due to vessel tortuosity and short neck.    IMPRESSION: 1. Evidence of <40% stenosis in the bilateral internal carotid artery. 2. No evidence of significant stenosis in the bilateral external carotid artery or common carotid artery. 3. Bilateral vertebral artery is antegrade.     Compared to the previous exam:  No previous exam at this facility for comparison.     ASSESSMENT:  [name] [name] is a 80 y.o. male who is s/p right superficial femoral to anterior tibial artery bypass graft placed 2008.  He returns today at Dr. Hart Rochester suggestion at patient's June, 2015 visit since patient complained of episodes of light headedness. Pt reports that his PCP cleaned out his right ear and the dizziness resolved.  Carotid Duplex today demonstrates minimal bilateral ICA stenosis, No evidence of significant stenosis in the bilateral external carotid artery or common carotid artery. Bilateral vertebral artery is antegrade.    PLAN:   Based on today's exam and non-invasive vascular lab results, the patient  will follow up in 5 months as already scheduled for for ABI's and right LE arterial Duplex, see Dr. Hart Rochester. He has potential fall issues, graduated walking program may not be suitable in that case.  He was instructed in leg and arm exercises while seated. I discussed in depth with the patient the nature of atherosclerosis, and emphasized the importance of maximal medical management including strict control of blood pressure, blood glucose, and lipid levels, obtaining regular exercise, and cessation of smoking.  The patient is aware that without maximal medical management the underlying atherosclerotic disease process will progress, limiting the benefit of any interventions. The patient was given information about stroke prevention and what symptoms should prompt the patient to seek immediate medical care.  The patient was given information about PAD including signs, symptoms, treatment, what symptoms should prompt the patient to seek immediate medical care, and risk reduction measures to take. Thank you for allowing [place] to participate in this patient's care.  Charisse March, RN, MSN, FNP-C [facility] Office: [phone]  Clinic MD: Imogene Burn 09/30/2013 4:07 PM

## 2013-11-03 ENCOUNTER — Ambulatory Visit (INDEPENDENT_AMBULATORY_CARE_PROVIDER_SITE_OTHER): Payer: Medicare Other

## 2013-11-03 VITALS — BP 148/63 | HR 85 | Resp 18

## 2013-11-03 DIAGNOSIS — L608 Other nail disorders: Secondary | ICD-10-CM

## 2013-11-03 DIAGNOSIS — E1140 Type 2 diabetes mellitus with diabetic neuropathy, unspecified: Secondary | ICD-10-CM

## 2013-11-03 DIAGNOSIS — E1142 Type 2 diabetes mellitus with diabetic polyneuropathy: Secondary | ICD-10-CM

## 2013-11-03 DIAGNOSIS — I739 Peripheral vascular disease, unspecified: Secondary | ICD-10-CM

## 2013-11-03 DIAGNOSIS — E1149 Type 2 diabetes mellitus with other diabetic neurological complication: Secondary | ICD-10-CM

## 2013-11-03 NOTE — Progress Notes (Signed)
   Subjective:    Patient ID: [name] [name], male    DOB: August 13, 1931, 80 y.o.   MRN: [id number]  HPI I NEED MY TOENAILS TRIMMED UP    Review of Systems no new findings or systemic changes noted     Objective:   Physical Exam Neurovascular status intact and unchanged DP postal for bilateral PT one over 4 bilateral capillary refill time 3 seconds all digits epicritic sensations diminished on Semmes Weinstein to forefoot digits nails 1 through 5 bilateral thick brittle crumbly friable dystrophic no open wounds ulcerations no secondary infection still in his diabetic shoes return back water. Are made exam unremarkable mild digital contractures noted 2 through 5 with hammertoe deformities somewhat flexible       Assessment & Plan:  Assessment diabetes with history of neuropathy thick dystrophic probably criptotic nails tender symptomatically debridement presence of pain as well as diabetes and complications return for future diabetic foot nail care in 3 months likely will be called next week when diabetic shoes ready for fitting and dispensing upon arrival  Alvan Dame DPM

## 2013-11-03 NOTE — Patient Instructions (Signed)
Diabetes and Foot Care Diabetes may cause you to have problems because of poor blood supply (circulation) to your feet and legs. This may cause the skin on your feet to become thinner, break easier, and heal more slowly. Your skin may become dry, and the skin may peel and crack. You may also have nerve damage in your legs and feet causing decreased feeling in them. You may not notice minor injuries to your feet that could lead to infections or more serious problems. Taking care of your feet is one of the most important things you can do for yourself.  HOME CARE INSTRUCTIONS  Wear shoes at all times, even in the house. Do not go barefoot. Bare feet are easily injured.  Check your feet daily for blisters, cuts, and redness. If you cannot see the bottom of your feet, use a mirror or ask someone for help.  Wash your feet with warm water (do not use hot water) and mild soap. Then pat your feet and the areas between your toes until they are completely dry. Do not soak your feet as this can dry your skin.  Apply a moisturizing lotion or petroleum jelly (that does not contain alcohol and is unscented) to the skin on your feet and to dry, brittle toenails. Do not apply lotion between your toes.  Trim your toenails straight across. Do not dig under them or around the cuticle. File the edges of your nails with an emery board or nail file.  Do not cut corns or calluses or try to remove them with medicine.  Wear clean socks or stockings every day. Make sure they are not too tight. Do not wear knee-high stockings since they may decrease blood flow to your legs.  Wear shoes that fit properly and have enough cushioning. To break in new shoes, wear them for just a few hours a day. This prevents you from injuring your feet. Always look in your shoes before you put them on to be sure there are no objects inside.  Do not cross your legs. This may decrease the blood flow to your feet.  If you find a minor scrape,  cut, or break in the skin on your feet, keep it and the skin around it clean and dry. These areas may be cleansed with mild soap and water. Do not cleanse the area with peroxide, alcohol, or iodine.  When you remove an adhesive bandage, be sure not to damage the skin around it.  If you have a wound, look at it several times a day to make sure it is healing.  Do not use heating pads or hot water bottles. They may burn your skin. If you have lost feeling in your feet or legs, you may not know it is happening until it is too late.  Make sure your health care provider performs a complete foot exam at least annually or more often if you have foot problems. Report any cuts, sores, or bruises to your health care provider immediately. SEEK MEDICAL CARE IF:   You have an injury that is not healing.  You have cuts or breaks in the skin.  You have an ingrown nail.  You notice redness on your legs or feet.  You feel burning or tingling in your legs or feet.  You have pain or cramps in your legs and feet.  Your legs or feet are numb.  Your feet always feel cold. SEEK IMMEDIATE MEDICAL CARE IF:   There is increasing redness,   swelling, or pain in or around a wound.  There is a red line that goes up your leg.  Pus is coming from a wound.  You develop a fever or as directed by your health care provider.  You notice a bad smell coming from an ulcer or wound. Document Released: 02/15/2000 Document Revised: 10/20/2012 Document Reviewed: 07/27/2012 ExitCare Patient Information 2015 [employer]. This information is not intended to replace advice given to you by your health care provider. Make sure you discuss any questions you have with your health care provider.  

## 2014-02-02 DIAGNOSIS — M1710 Unilateral primary osteoarthritis, unspecified knee: Secondary | ICD-10-CM | POA: Insufficient documentation

## 2014-02-02 DIAGNOSIS — M179 Osteoarthritis of knee, unspecified: Secondary | ICD-10-CM | POA: Insufficient documentation

## 2014-02-03 ENCOUNTER — Ambulatory Visit (INDEPENDENT_AMBULATORY_CARE_PROVIDER_SITE_OTHER): Payer: Medicare Other

## 2014-02-03 VITALS — BP 124/64 | HR 90 | Resp 18

## 2014-02-03 DIAGNOSIS — I739 Peripheral vascular disease, unspecified: Secondary | ICD-10-CM

## 2014-02-03 DIAGNOSIS — L603 Nail dystrophy: Secondary | ICD-10-CM

## 2014-02-03 DIAGNOSIS — M79605 Pain in left leg: Secondary | ICD-10-CM

## 2014-02-03 DIAGNOSIS — Q828 Other specified congenital malformations of skin: Secondary | ICD-10-CM

## 2014-02-03 DIAGNOSIS — E1140 Type 2 diabetes mellitus with diabetic neuropathy, unspecified: Secondary | ICD-10-CM

## 2014-02-03 NOTE — Progress Notes (Signed)
   Subjective:    Patient ID: Dayna BarkerJames C Deloatch, male    DOB: 10-26-1931, 80 y.o.   MRN: [id number]  HPI I AM HERE TO GET TOENAILS TRIMMED UP There was also confusion and shoes are ready for pick up as well this time   Review of Systems no new findings or systemic changes noted     Objective:   Physical Exam  Neurovascular status is unchanged pedal pulses DP +2 PT plus one over 4 Refill time 3 seconds epicritic sensations diminished on Semmes Weinstein the forefoot digits nails thick brittle Crumley friable and dystrophic consistent with onychomycosis and dystrophy of nails no open wounds or ulcers no secondary infections patient is mild/moderate digital contractures 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      Assessment & Plan:  Assessment this time is diabetes history peripheral neuropathy and angiopathy and PVD has history keratoses with digital deformities HAV deformity dispensed 1 pair shoes and 3 pairs of dual density mass Plastizote inlays molded to model of the patient's foot the shoes in lace fit with full contact to the foot. Instructions for shoewear and break in are given to the patient at this time. Should note patient was wearing socks today's encouraged were socks with issues at all times. Nails thick brittle Crumley friable dystrophic are debrided 10 lumicain Neosporin applied to the right hallux following debridement and a Band-Aid is applied recheck in 3 months for follow-up and continued palliative care in the future as needed next para Earl GalaGraff Carlisle Enke DPM

## 2014-02-03 NOTE — Patient Instructions (Signed)
Diabetes and Foot Care Diabetes may cause you to have problems because of poor blood supply (circulation) to your feet and legs. This may cause the skin on your feet to become thinner, break easier, and heal more slowly. Your skin may become dry, and the skin may peel and crack. You may also have nerve damage in your legs and feet causing decreased feeling in them. You may not notice minor injuries to your feet that could lead to infections or more serious problems. Taking care of your feet is one of the most important things you can do for yourself.  HOME CARE INSTRUCTIONS  Wear shoes at all times, even in the house. Do not go barefoot. Bare feet are easily injured.  Check your feet daily for blisters, cuts, and redness. If you cannot see the bottom of your feet, use a mirror or ask someone for help.  Wash your feet with warm water (do not use hot water) and mild soap. Then pat your feet and the areas between your toes until they are completely dry. Do not soak your feet as this can dry your skin.  Apply a moisturizing lotion or petroleum jelly (that does not contain alcohol and is unscented) to the skin on your feet and to dry, brittle toenails. Do not apply lotion between your toes.  Trim your toenails straight across. Do not dig under them or around the cuticle. File the edges of your nails with an emery board or nail file.  Do not cut corns or calluses or try to remove them with medicine.  Wear clean socks or stockings every day. Make sure they are not too tight. Do not wear knee-high stockings since they may decrease blood flow to your legs.  Wear shoes that fit properly and have enough cushioning. To break in new shoes, wear them for just a few hours a day. This prevents you from injuring your feet. Always look in your shoes before you put them on to be sure there are no objects inside.  Do not cross your legs. This may decrease the blood flow to your feet.  If you find a minor scrape,  cut, or break in the skin on your feet, keep it and the skin around it clean and dry. These areas may be cleansed with mild soap and water. Do not cleanse the area with peroxide, alcohol, or iodine.  When you remove an adhesive bandage, be sure not to damage the skin around it.  If you have a wound, look at it several times a day to make sure it is healing.  Do not use heating pads or hot water bottles. They may burn your skin. If you have lost feeling in your feet or legs, you may not know it is happening until it is too late.  Make sure your health care provider performs a complete foot exam at least annually or more often if you have foot problems. Report any cuts, sores, or bruises to your health care provider immediately. SEEK MEDICAL CARE IF:   You have an injury that is not healing.  You have cuts or breaks in the skin.  You have an ingrown nail.  You notice redness on your legs or feet.  You feel burning or tingling in your legs or feet.  You have pain or cramps in your legs and feet.  Your legs or feet are numb.  Your feet always feel cold. SEEK IMMEDIATE MEDICAL CARE IF:   There is increasing redness,   swelling, or pain in or around a wound.  There is a red line that goes up your leg.  Pus is coming from a wound.  You develop a fever or as directed by your health care provider.  You notice a bad smell coming from an ulcer or wound. Document Released: 02/15/2000 Document Revised: 10/20/2012 Document Reviewed: 07/27/2012 ExitCare Patient Information 2015 [employer]. This information is not intended to replace advice given to you by your health care provider. Make sure you discuss any questions you have with your health care provider.  

## 2014-02-09 ENCOUNTER — Encounter (HOSPITAL_COMMUNITY): Payer: Self-pay | Admitting: Vascular Surgery

## 2014-02-12 DIAGNOSIS — I639 Cerebral infarction, unspecified: Secondary | ICD-10-CM

## 2014-02-12 HISTORY — DX: Cerebral infarction, unspecified: I63.9

## 2014-02-28 ENCOUNTER — Encounter: Payer: Self-pay | Admitting: Vascular Surgery

## 2014-03-07 ENCOUNTER — Encounter (HOSPITAL_COMMUNITY): Payer: Medicare Other

## 2014-03-07 ENCOUNTER — Other Ambulatory Visit (HOSPITAL_COMMUNITY): Payer: Medicare Other

## 2014-03-07 ENCOUNTER — Ambulatory Visit: Payer: Medicare Other | Admitting: Vascular Surgery

## 2014-04-11 ENCOUNTER — Encounter: Payer: Self-pay | Admitting: Critical Care Medicine

## 2014-04-11 ENCOUNTER — Ambulatory Visit (INDEPENDENT_AMBULATORY_CARE_PROVIDER_SITE_OTHER): Payer: Medicare Other | Admitting: Critical Care Medicine

## 2014-04-11 VITALS — BP 136/60 | HR 88 | Temp 95.9°F | Ht 71.0 in | Wt 242.0 lb

## 2014-04-11 DIAGNOSIS — E1151 Type 2 diabetes mellitus with diabetic peripheral angiopathy without gangrene: Secondary | ICD-10-CM

## 2014-04-11 DIAGNOSIS — E1142 Type 2 diabetes mellitus with diabetic polyneuropathy: Secondary | ICD-10-CM

## 2014-04-11 DIAGNOSIS — R0600 Dyspnea, unspecified: Secondary | ICD-10-CM

## 2014-04-11 DIAGNOSIS — I10 Essential (primary) hypertension: Secondary | ICD-10-CM | POA: Insufficient documentation

## 2014-04-11 DIAGNOSIS — I739 Peripheral vascular disease, unspecified: Secondary | ICD-10-CM

## 2014-04-11 DIAGNOSIS — M5416 Radiculopathy, lumbar region: Secondary | ICD-10-CM

## 2014-04-11 DIAGNOSIS — I779 Disorder of arteries and arterioles, unspecified: Secondary | ICD-10-CM

## 2014-04-11 DIAGNOSIS — J418 Mixed simple and mucopurulent chronic bronchitis: Secondary | ICD-10-CM

## 2014-04-11 DIAGNOSIS — I639 Cerebral infarction, unspecified: Secondary | ICD-10-CM

## 2014-04-11 DIAGNOSIS — G4736 Sleep related hypoventilation in conditions classified elsewhere: Secondary | ICD-10-CM

## 2014-04-11 DIAGNOSIS — J449 Chronic obstructive pulmonary disease, unspecified: Secondary | ICD-10-CM | POA: Insufficient documentation

## 2014-04-11 DIAGNOSIS — I429 Cardiomyopathy, unspecified: Secondary | ICD-10-CM

## 2014-04-11 DIAGNOSIS — I2510 Atherosclerotic heart disease of native coronary artery without angina pectoris: Secondary | ICD-10-CM

## 2014-04-11 DIAGNOSIS — E669 Obesity, unspecified: Secondary | ICD-10-CM

## 2014-04-11 DIAGNOSIS — E785 Hyperlipidemia, unspecified: Secondary | ICD-10-CM

## 2014-04-11 MED ORDER — ALBUTEROL SULFATE (2.5 MG/3ML) 0.083% IN NEBU: 2.5000 mg | INHALATION_SOLUTION | Freq: Four times a day (QID) | RESPIRATORY_TRACT | Status: DC | PRN

## 2014-04-11 MED ORDER — COMPRESSOR/NEBULIZER MISC: Status: AC

## 2014-04-11 MED ORDER — ARFORMOTEROL TARTRATE 15 MCG/2ML IN NEBU: 15.0000 ug | INHALATION_SOLUTION | Freq: Two times a day (BID) | RESPIRATORY_TRACT | Status: DC

## 2014-04-11 NOTE — Addendum Note (Signed)
Addended by: Gweneth DimitriJONES, Akiva Brassfield D on: 04/11/2014 02:12 PM   Modules accepted: Orders

## 2014-04-11 NOTE — Patient Instructions (Addendum)
Start Brovana one in nebulizer twice daily Stop advair Use albuterol in nebulizer as needed An overnight oxygen test will be obtained Return 2 months

## 2014-04-11 NOTE — Progress Notes (Signed)
   Subjective:    Patient ID: [name] BarkerJames C [name], male    DOB: 09/02/1931, 81 y.o.   MRN: [id number]  HPI Comments: Referral Copd   Dyspnea for 1-211yrs. DOE only ok at rest. On no oxygen.   Shortness of Breath This is a chronic problem. The current episode started more than 1 year ago. Associated symptoms include leg pain, leg swelling and wheezing. Pertinent negatives include no abdominal pain, chest pain, claudication, coryza, ear pain, fever, headaches, hemoptysis, neck pain, orthopnea, PND, rhinorrhea, sore throat, sputum production, swollen glands, syncope or vomiting. 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 Associated symptoms comments: occ dry cough. Risk factors include smoking. He has tried beta agonist inhalers for the symptoms. The treatment provided moderate relief. His past medical history is significant for CAD and pneumonia. There is no history of allergies, aspirin allergies, asthma, bronchiolitis, chronic lung disease, COPD, DVT, a heart failure or PE. (Hx CABG 1989, PNA 01/2013: in hosp)      Review of Systems  Constitutional: Negative for fever.  HENT: Negative for ear pain, postnasal drip, rhinorrhea, sinus pressure, sore throat, trouble swallowing and voice change.   Respiratory: Positive for cough, shortness of breath and wheezing. Negative for apnea, hemoptysis, sputum production, choking and chest tightness.   Cardiovascular: Positive for leg swelling. Negative for chest pain, orthopnea, claudication, syncope and PND.  Gastrointestinal: Negative for vomiting and abdominal pain.       Notes some GERD, PPI helps   Musculoskeletal: Negative for neck pain.  Neurological: Negative for headaches.       LUE weak from CVA.         Objective:   Physical Exam        Assessment & Plan:

## 2014-04-11 NOTE — Assessment & Plan Note (Signed)
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

## 2014-04-26 ENCOUNTER — Telehealth: Payer: Self-pay | Admitting: Critical Care Medicine

## 2014-04-26 NOTE — Telephone Encounter (Signed)
Pt had ONO on RA on 04/13/14. Per Dr. Delford FieldWright: Let pt know it is normal. Spoke with pt's wife.  Discussed results per Dr. Delford FieldWright.  She verbalized understanding, will inform pt, and voiced no further questions or concerns at this time. Results placed in scan folder.

## 2014-05-08 ENCOUNTER — Ambulatory Visit (INDEPENDENT_AMBULATORY_CARE_PROVIDER_SITE_OTHER): Payer: Medicare Other

## 2014-05-08 VITALS — BP 86/53 | HR 83 | Resp 18

## 2014-05-08 DIAGNOSIS — M79605 Pain in left leg: Secondary | ICD-10-CM

## 2014-05-08 DIAGNOSIS — I739 Peripheral vascular disease, unspecified: Secondary | ICD-10-CM

## 2014-05-08 DIAGNOSIS — L603 Nail dystrophy: Secondary | ICD-10-CM

## 2014-05-08 DIAGNOSIS — Q828 Other specified congenital malformations of skin: Secondary | ICD-10-CM

## 2014-05-08 DIAGNOSIS — B351 Tinea unguium: Secondary | ICD-10-CM | POA: Diagnosis not present

## 2014-05-08 DIAGNOSIS — E1151 Type 2 diabetes mellitus with diabetic peripheral angiopathy without gangrene: Secondary | ICD-10-CM

## 2014-05-08 DIAGNOSIS — M79676 Pain in unspecified toe(s): Secondary | ICD-10-CM | POA: Diagnosis not present

## 2014-05-08 DIAGNOSIS — E1140 Type 2 diabetes mellitus with diabetic neuropathy, unspecified: Secondary | ICD-10-CM | POA: Diagnosis not present

## 2014-05-08 DIAGNOSIS — E1159 Type 2 diabetes mellitus with other circulatory complications: Secondary | ICD-10-CM

## 2014-05-08 NOTE — Progress Notes (Signed)
   Subjective:    Patient ID: [name] BarkerJames C [name], male    DOB: 01/16/1932, 81 y.o.   MRN: [id number]  HPI I NEED MY TOENAILS TRIMMED UP    Review of Systems no new findings or systemic changes noted     Objective:   Physical Exam Lower extremity objective findings reveal diminished vascular status pedal pulses DP +2 PT thready one over 4 bilateral capillary refill time 4 seconds all digits patient is minimally ambulatory does use a walker times does use a wheelchair for transport. Nails thick brittle Crumley friable dystrophic 1 through 5 bilateral painful and tender both on palpation closure were and friability on debridement noted. Orthopedic biomechanical exam rectus foot type mild rigid digital contractures noted 234 and 5 there is no open ulcers no open wounds mild HAV deformity noted as well. Neurologically epicritic sensations diminished on Semmes Weinstein the forefoot digits and arch there is normal plantar response DTRs not elicited. Orthopedic exam otherwise unremarkable except for dystrophy and friability of nails due to digital contractures and difficult he with gait and ambulation noted. No open wounds no ulcers no secondary infections at the current time.       Assessment & Plan:  Assessment this time is diabetes history peripheral neuropathy as well as peripheral angiopathy and basket compromise. Keratoses are stable however this time has thick brittle crumbly dystrophic frontal mycotic nails 1 through 5 bilateral which are debrided and the presence of pain symptoms while she was diabetes and complications maintaining diabetic shoes moderate activities for ambulation return in 3 months for follow-up and continued palliative care in the future as needed  [name] [name] DPM

## 2014-05-15 ENCOUNTER — Telehealth: Payer: Self-pay | Admitting: Critical Care Medicine

## 2014-05-15 NOTE — Telephone Encounter (Signed)
atc line provided for pt's granddaughter X3- "line not in service at this time." No other number listed for pt's granddaughter.  Wcb.

## 2014-05-16 NOTE — Telephone Encounter (Signed)
ATC # provided and received message line not in service.  Called pt home #. Spoke with spouse. She reports she is being told that the pharmacy will not pay for the albuterol if it states PRN. She reports she spoke to someone last week regarding this but no phone message in epic. Per spouse they have not tried getting it from [facility] bc they have not called her to tell them anything was ready.  Called [facility] and pharm does not open until after 9am. WCB  Lillia AbedLindsay cell # is [phone]

## 2014-05-17 NOTE — Telephone Encounter (Signed)
ATC granddaughter x 3 " line is not in service at this time" Called and spoke with patient wife. Pt has received his albuterol neb.  Pt wife is going to discuss with patient and granddaughter about the need for a rescue inhaler and will call back Later.

## 2014-05-17 NOTE — Telephone Encounter (Signed)
Called pharmacy, states that albuterol neb was filled yesterday with no charge to the patient.  Pt does not have a rescue inhaler on his med list. lmtcb X1 for granddaughter [name] [name]. atc pt at home # X2, line busy.   Wcb.

## 2014-05-17 NOTE — Telephone Encounter (Signed)
Return call.[name] [name] °

## 2014-05-18 MED ORDER — ALBUTEROL SULFATE HFA 108 (90 BASE) MCG/ACT IN AERS: 2.0000 | INHALATION_SPRAY | Freq: Four times a day (QID) | RESPIRATORY_TRACT | Status: DC | PRN

## 2014-05-18 NOTE — Telephone Encounter (Signed)
i am ok to order prn albuterol hfa

## 2014-05-18 NOTE — Telephone Encounter (Signed)
Dr. Delford FieldWright did you want pt to have rescue inhaler? If so we can order.

## 2014-05-18 NOTE — Telephone Encounter (Signed)
Spoke with pt's wife and advised that rx for Albuterol rescue inhaler was sent to pharmacy.

## 2014-06-09 ENCOUNTER — Encounter: Payer: Self-pay | Admitting: Critical Care Medicine

## 2014-08-10 ENCOUNTER — Ambulatory Visit: Payer: Medicare Other

## 2014-11-14 ENCOUNTER — Telehealth: Payer: Self-pay | Admitting: Critical Care Medicine

## 2014-11-14 ENCOUNTER — Encounter: Payer: Self-pay | Admitting: Critical Care Medicine

## 2014-11-14 ENCOUNTER — Ambulatory Visit (INDEPENDENT_AMBULATORY_CARE_PROVIDER_SITE_OTHER): Payer: Medicare Other | Admitting: Critical Care Medicine

## 2014-11-14 VITALS — BP 132/62 | HR 78 | Temp 97.7°F | Ht 72.0 in | Wt 253.4 lb

## 2014-11-14 DIAGNOSIS — J449 Chronic obstructive pulmonary disease, unspecified: Secondary | ICD-10-CM

## 2014-11-14 DIAGNOSIS — J432 Centrilobular emphysema: Secondary | ICD-10-CM

## 2014-11-14 MED ORDER — ARFORMOTEROL TARTRATE 15 MCG/2ML IN NEBU: 15.0000 ug | INHALATION_SOLUTION | Freq: Two times a day (BID) | RESPIRATORY_TRACT | Status: AC

## 2014-11-14 MED ORDER — ALBUTEROL SULFATE HFA 108 (90 BASE) MCG/ACT IN AERS: 2.0000 | INHALATION_SPRAY | Freq: Four times a day (QID) | RESPIRATORY_TRACT | Status: AC | PRN

## 2014-11-14 MED ORDER — ALBUTEROL SULFATE (2.5 MG/3ML) 0.083% IN NEBU: 2.5000 mg | INHALATION_SOLUTION | Freq: Four times a day (QID) | RESPIRATORY_TRACT | Status: AC | PRN

## 2014-11-14 NOTE — Telephone Encounter (Signed)
Called pharmacy and was informed pt's [name] [name] needs a PA.  Called [facility] at [phone] and spoke to [name] [name]. She stated the PA is not needed and the Brovana just needs to be billed through part B. Pt's ID: [id number] Called pharmacy back and informed the pharmacy tech the medication needs to be billed through part B. Pharmacy tech stated she will let [name] [name] know and they will call back if there are issues. Nothing further needed at this time.

## 2014-11-14 NOTE — Progress Notes (Signed)
Subjective:    Patient ID: [name] [name], male    DOB: April 09, 1931, 81 y.o.   MRN: [id number]  HPI 11/14/2014 Chief Complaint  Patient presents with  . Follow-up    Feeling the same,sob-same,saw cardiologist yesterday holding fld.increased,increased fld. pill yesterday and added Metolazone.Wheezing, dry cough.denies cp or tightness. no fcs.   [place], out of the SNF no hosp stay since 04/2014.  No real cough, no mucus. Notes some wheezing.  Now on zaroxolyn in addition to other meds  Pt denies any significant sore throat, nasal congestion or excess secretions, fever, chills, sweats, unintended weight loss, pleurtic or exertional chest pain, orthopnea PND, or leg swelling Pt denies any increase in rescue therapy over baseline, denies waking up needing it or having any early am or nocturnal exacerbations of coughing/wheezing/or dyspnea. Pt also denies any obvious fluctuation in symptoms with  weather or environmental change or other alleviating or aggravating factors   Current Medications, Allergies, Complete Past Medical History, Past Surgical History, Family History, and Social History were reviewed in [employer] electronic medical record per todays encounter:  11/14/2014   Review of Systems  Constitutional: Negative.   HENT: Negative.  Negative for ear pain, postnasal drip, rhinorrhea, sinus pressure, sore throat, trouble swallowing and voice change.   Eyes: Negative.   Respiratory: Positive for cough and shortness of breath. Negative for apnea, choking, chest tightness, wheezing and stridor.   Cardiovascular: Positive for leg swelling. Negative for chest pain and palpitations.  Gastrointestinal: Negative.  Negative for nausea, vomiting, abdominal pain and abdominal distention.  Genitourinary: Negative.   Musculoskeletal: Negative.  Negative for myalgias and arthralgias.  Skin: Negative.  Negative for rash.  Allergic/Immunologic: Negative.  Negative for environmental allergies  and food allergies.  Neurological: Negative.  Negative for dizziness, syncope, weakness and headaches.  Hematological: Negative.  Negative for adenopathy. Does not bruise/bleed easily.  Psychiatric/Behavioral: Negative.  Negative for sleep disturbance and agitation. The patient is not nervous/anxious.        Objective:   Physical Exam Filed Vitals:   11/14/14 0952  BP: 132/62  Pulse: 78  Temp: 97.7 F (36.5 C)  TempSrc: Oral  Height: 6' (1.829 m)  Weight: 253 lb 6.4 oz (114.941 kg)  SpO2: 95%    Gen: Pleasant, well-nourished, in no distress,  normal affect  ENT: No lesions,  mouth clear,  oropharynx clear, no postnasal drip  Neck: No JVD, no TMG, no carotid bruits  Lungs: No use of accessory muscles, no dullness to percussion, distant bs  Cardiovascular: RRR, heart sounds normal, no murmur or gallops, no peripheral edema  Abdomen: soft and NT, no HSM,  BS normal  Musculoskeletal: No deformities, no cyanosis or clubbing  Neuro: alert, non focal  Skin: Warm, no lesions or rashes  No results found.       Assessment & Plan:  I personally reviewed all images and lab data in the [facility] system as well as any outside material available during this office visit and agree with the  radiology impressions.   COPD (chronic obstructive pulmonary disease) Copd gold D No need for oxygen Plan Cont inhaled meds Cont cpap qhs  No oxygen need qhs    [name] [name] was seen today for follow-up.  Diagnoses and all orders for this visit:  COPD with chronic bronchitis  Centrilobular emphysema  Other orders -     albuterol (PROVENTIL) (2.5 MG/3ML) 0.083% nebulizer solution; Take 3 mLs (2.5 mg total) by nebulization every 6 (six) hours  as needed for wheezing or shortness of breath. Dx Code J41.8 -     albuterol (PROVENTIL HFA;VENTOLIN HFA) 108 (90 BASE) MCG/ACT inhaler; Inhale 2 puffs into the lungs every 6 (six) hours as needed for wheezing or shortness of breath. -     arformoterol  (BROVANA) 15 MCG/2ML NEBU; Take 2 mLs (15 mcg total) by nebulization 2 (two) times daily.

## 2014-11-14 NOTE — Patient Instructions (Signed)
No change in medications. Return in         4 months 

## 2014-11-14 NOTE — Telephone Encounter (Signed)
Pharmacy called back and states that [name] [name] needs PA.  If billed to Medicare Part B it costs him over $100 per box If we can get it approved through Part D he only has to pay $3.00 AARP plan  Initiated PA on Covermymeds Key: [id number] Awaiting response. Will hold in Triage

## 2014-11-15 NOTE — Telephone Encounter (Signed)
Pt daughter calling about the PA on her father brovonna says he is completely out please advise and she can be reached @ [phone].Caren Griffins

## 2014-11-15 NOTE — Telephone Encounter (Signed)
Called and spoke to pt's daughter. Informed her we have Brovana samples to last till we receive response back from the PA. Samples left up front for pick up.   Will forward to Crystal to continue to follow the PA.

## 2014-11-15 NOTE — Telephone Encounter (Signed)
Since PA already initiated will forward to CJ to f/u on

## 2014-11-16 ENCOUNTER — Encounter: Payer: Self-pay | Admitting: *Deleted

## 2014-11-16 NOTE — Assessment & Plan Note (Signed)
Copd gold D No need for oxygen Plan Cont inhaled meds Cont cpap qhs  No oxygen need qhs

## 2014-11-16 NOTE — Telephone Encounter (Signed)
Called to let pt know med approved and his only number listed has been d/c'ed I called [facility] and spoke with Brayton Caves  She states number to reach pt is [phone]  She has already informed pt med approved  I have updated contact list  Nothing further needed

## 2014-11-16 NOTE — Telephone Encounter (Signed)
Received Brovana denial under Part D.  Stating it may be covered under Part B.  I spoke with [facility].  Medication is going through under Part B but is > $100.  Given pt lives in [facility], will try to appeal the denial under Part D to see if this lowers the cost of medication. Larence Penning D PA Appeal faxed to [phone] as an expedited request.    [email or web address] for pt's daughter at # provided below to inform her of above.

## 2014-11-16 NOTE — Telephone Encounter (Signed)
CJ received denial and will see to proceed on appeal

## 2014-11-16 NOTE — Telephone Encounter (Signed)
Calling stating that brovanna has been approved # [id number] effective 9/13-12/31.Caren Griffins

## 2014-11-21 ENCOUNTER — Telehealth: Payer: Self-pay | Admitting: Critical Care Medicine

## 2014-11-21 NOTE — Telephone Encounter (Signed)
Rec'd records from [facility] Forwarding 6 page's to Dr.Wright

## 2014-11-27 ENCOUNTER — Telehealth: Payer: Self-pay | Admitting: Critical Care Medicine

## 2014-11-27 NOTE — Telephone Encounter (Signed)
Rec'd from [facility] forward 8 pages to Dr.Wright

## 2014-11-30 ENCOUNTER — Telehealth: Payer: Self-pay | Admitting: Critical Care Medicine

## 2014-11-30 NOTE — Telephone Encounter (Signed)
Called and spoke with Debbie with transportation at [facility] stated that she has already scheduled pt with another pulm dr in [place]  She stated that Dr Jeanie Sewer probably did not know that when he called the office  Debbie requested that i cancel appt with TP on 12-05-14   appt cancelled  Nothing further is needed

## 2014-11-30 NOTE — Telephone Encounter (Signed)
Called [facility] and spoke with [place]. Made aware of appt date/time/location of office.

## 2014-11-30 NOTE — Telephone Encounter (Signed)
[phone], Debbie in transportation in [facility].

## 2014-11-30 NOTE — Telephone Encounter (Signed)
Took call from Dr. Jeanie Sewer Pt is currently at [facility] nursing in [place] after admit to [facility] for COPD exacerbation and CHF.  Pt is stable, on continuous O2, last BNP was normal.  Notes to be coming to the office.  Dr Jeanie Sewer wanting pt to have follow up with PW.  Advised Dr Jeanie Sewer PW no longer seeing pts in clinic - Dr Jeanie Sewer okay with pt seeing TP as no other MD's have openings.  Appt scheduled with TP for 10.4.16 @ 1115 for HFU.  Dr Jeanie Sewer asked that [facility] and/or family be advised.  Called [facility] in [place] @ [phone], spoke with receptionist Debbie who was unable to get pt's nurse on the phone to advise of appt date/time.  WCB.

## 2014-12-05 ENCOUNTER — Inpatient Hospital Stay: Payer: Medicare Other | Admitting: Adult Health

## 2014-12-19 ENCOUNTER — Inpatient Hospital Stay: Payer: Medicare Other | Admitting: Adult Health

## 2014-12-22 ENCOUNTER — Ambulatory Visit (INDEPENDENT_AMBULATORY_CARE_PROVIDER_SITE_OTHER): Payer: Medicare Other | Admitting: Adult Health

## 2014-12-22 ENCOUNTER — Encounter: Payer: Self-pay | Admitting: Adult Health

## 2014-12-22 VITALS — BP 138/68 | HR 105 | Temp 97.7°F | Ht 72.0 in | Wt 240.0 lb

## 2014-12-22 DIAGNOSIS — I639 Cerebral infarction, unspecified: Secondary | ICD-10-CM | POA: Diagnosis not present

## 2014-12-22 DIAGNOSIS — J440 Chronic obstructive pulmonary disease with acute lower respiratory infection: Secondary | ICD-10-CM | POA: Diagnosis not present

## 2014-12-22 MED ORDER — AMOXICILLIN-POT CLAVULANATE 875-125 MG PO TABS: 1.0000 | ORAL_TABLET | Freq: Two times a day (BID) | ORAL | Status: AC

## 2014-12-22 NOTE — Assessment & Plan Note (Signed)
Exacerbation -slow to resolve  Check cxr today  On amiodarone ,may need to consider CT /ESR going forward if not improving  Consider changing coreg to more selective beta blocker d/t severe lung disease w/ frequent flares.   Plan  Augmentin 875mg  Twice daily  For 7 days  Increase prednisone 10mg  daily .  Mucinex DM Twice daily  As needed  Cough/congestion  Chest xray today .  Discuss with cardiologist to consider changing Coreg to alterantive beta blocker such at [facility] .  follow up Dr. Melvyn Novas  In 2 weeks and As needed   Please contact office for sooner follow up if symptoms do not improve or worsen or seek emergency care

## 2014-12-22 NOTE — Patient Instructions (Signed)
Augmentin 875mg  Twice daily  For 7 days  Increase prednisone 10mg  daily .  Mucinex DM Twice daily  As needed  Cough/congestion  Chest xray today .  Discuss with cardiologist to consider changing Coreg [facility] .  follow up Dr. Sherene SiresWert  In 2 weeks and As needed   Please contact office for sooner follow up if symptoms do not improve or worsen or seek emergency care

## 2014-12-22 NOTE — Progress Notes (Signed)
   Subjective:    Patient ID: [name] BarkerJames C Dunkel, male    DOB: Nov 03, 1931, 81 y.o.   MRN: [id number]  HPI 81 year old male with COPD . Chronic resp failure on chronic O2 and sleep apnea  12/22/2014 [facility] follow up  Patient returns for a post hospital follow-up Patient was recently [facility] for COPD exacerbation He was treated with IV antibiotics and steroids On Brovana and Duoneb. Remains on Amiodarone , Coreg .  Leg swelling is better.  Still coughing up thick mucus with rattling cough . Coughing up thick green mcus.  Wheezing on /off.  On prednisone 5mg  daily       Review of Systems  Constitutional:   No  weight loss, night sweats,  Fevers, chills,  +fatigue, or  lassitude.  HEENT:   No headaches,  Difficulty swallowing,  Tooth/dental problems, or  Sore throat,                No sneezing, itching, ear ache, nasal congestion, post nasal drip,   CV:  No chest pain,  Orthopnea, PND, swelling in lower extremities, anasarca, dizziness, palpitations, syncope.   GI  No heartburn, indigestion, abdominal pain, nausea, vomiting, diarrhea, change in bowel habits, loss of appetite, bloody stools.   Resp:   No chest wall deformity  Skin: no rash or lesions.  GU: no dysuria, change in color of urine, no urgency or frequency.  No flank pain, no hematuria   MS:  No joint pain or swelling.  No decreased range of motion.  No back pain.  Psych:  No change in mood or affect. No depression or anxiety.  No memory loss.          Objective:   Physical Exam GEN: A/Ox3; pleasant , NAD, chronically ill-appearing, elderly, on oxygen , in wheelchair  HEENT:  [place]/AT,  EACs-clear, TMs-wnl, NOSE-clear, THROAT-clear, no lesions, no postnasal drip or exudate noted.   NECK:  Supple w/ fair ROM; no JVD; normal carotid impulses w/o bruits; no thyromegaly or nodules palpated; no lymphadenopathy.  RESP  Few rhonchi ,  no accessory muscle use, no dullness to  percussion  CARD:  RRR, no m/r/g  , tr  peripheral edema, pulses intact, no cyanosis or clubbing.  GI:   Soft & nt; nml bowel sounds; no organomegaly or masses detected.  Musco: Warm bil, no deformities or joint swelling noted.   Neuro: alert, no focal deficits noted.    Skin: Warm, no lesions or rashes         Assessment & Plan:

## 2014-12-22 NOTE — Progress Notes (Signed)
Chart and office note reviewed in detail  > agree with a/p as outlined , also concern re use of amiodarone and coreg in pt with difficult to control airway/ lung issues

## 2015-01-09 ENCOUNTER — Ambulatory Visit (INDEPENDENT_AMBULATORY_CARE_PROVIDER_SITE_OTHER): Payer: Medicare Other | Admitting: Internal Medicine

## 2015-01-09 ENCOUNTER — Encounter: Payer: Self-pay | Admitting: Internal Medicine

## 2015-01-09 VITALS — BP 120/78 | HR 80 | Ht 72.0 in | Wt 240.0 lb

## 2015-01-09 DIAGNOSIS — I639 Cerebral infarction, unspecified: Secondary | ICD-10-CM

## 2015-01-09 DIAGNOSIS — J449 Chronic obstructive pulmonary disease, unspecified: Secondary | ICD-10-CM | POA: Diagnosis not present

## 2015-01-09 DIAGNOSIS — I10 Essential (primary) hypertension: Secondary | ICD-10-CM

## 2015-01-09 DIAGNOSIS — J9611 Chronic respiratory failure with hypoxia: Secondary | ICD-10-CM

## 2015-01-09 NOTE — Progress Notes (Signed)
Subjective:    Patient ID: [name] [name], male     DOB: Jan 04, 1932     MRN: [id number]    Brief patient profile:  81 year old male with COPD GOLD IV  . Chronic resp failure on chronic O2 and sleep apnea    History of Present Illness  12/22/2014 [facility] follow up  Patient returns for a post hospital follow-up Patient was recently admitted to [facility] for COPD exacerbation He was treated with IV antibiotics and steroids On Brovana and Duoneb. Remains on Amiodarone , Coreg .  Leg swelling is better.  Still coughing up thick mucus with rattling cough . Coughing up thick green mcus.  Wheezing on /off.  On prednisone  daily  rec Augmentin  Twice daily  For 7 days  Increase prednisone  daily .  Mucinex DM Twice daily  As needed  Cough/congestion  Chest xray today .  Discuss with cardiologist to consider changing Coreg to alterantive beta blocker such at [facility] .  follow up [name] [name]  In 2 weeks and As needed      01/09/2015  transition of care ov/ extended  /[name] [name] re: GOLD IV copd/ chronic steroid dep/ 02 dep  Chief Complaint  Patient presents with  . Follow-up    pt following for COPD: pt states he is doing ok, hes feeling about the same since he last seen TP. Pt using O2 set at 1LPM continuous.  pt c/o dry cough a little chest tightness and SOB.  Sleeps at 45 on cpap , keeps 02 1lpm 24/7  Still on prednisone at 10 mg daily and high doses of coreg SOB x bed to w/c = MMRC4  = sob if tries to leave home or while getting dressed     No obvious day to day or daytime variability or assoc excess mucus production  or cp  , subjective wheeze or overt sinus or hb symptoms. No unusual exp hx or h/o childhood pna/ asthma or knowledge of premature birth.  Sleeping ok on cpap  without nocturnal  or early am exacerbation  of respiratory  c/o's or need for noct saba. Also denies any obvious fluctuation of symptoms with weather or environmental changes or other  aggravating or alleviating factors except as outlined above   Current Medications, Allergies, Complete Past Medical History, Past Surgical History, Family History, and Social History were reviewed in [employer] record.  ROS  The following are not active complaints unless bolded sore throat, dysphagia, dental problems, itching, sneezing,  nasal congestion or excess/ purulent secretions, ear ache,   fever, chills, sweats, unintended wt loss, classically pleuritic or exertional cp, hemoptysis,  orthopnea pnd or leg swelling, presyncope, palpitations, abdominal pain, anorexia, nausea, vomiting, diarrhea  or change in bowel or bladder habits, change in stools or urine, dysuria,hematuria,  rash, arthralgias, visual complaints, headache, numbness, weakness or ataxia or problems with walking or coordination,  change in mood/affect or memory.                Objective:   Physical Exam GEN: A/Ox3; pleasant , NAD, chronically ill-appearing, elderly, on oxygen in w/c  Wt Readings from Last 3 Encounters:  01/09/15 240 lb (108.863 kg)  12/22/14 240 lb (108.863 kg)  11/14/14 253 lb 6.4 oz (114.941 kg)    Vital signs reviewed   HEENT:  [place]/AT,  EACs-clear, TMs-wnl, NOSE-clear, THROAT-clear, no lesions, no postnasal drip or exudate noted.   NECK:  Supple w/ fair ROM; no JVD;  normal carotid impulses w/o bruits; no thyromegaly or nodules palpated; no lymphadenopathy.  RESP  Few rhonchi insp and exp bilaterally  ,  no accessory muscle use, no dullness to percussion  CARD:  RRR, no m/r/g  , tr  peripheral pitting bilateral lower ext edema, pulses intact, no cyanosis or clubbing.  GI:   Soft & nt; nml bowel sounds; no organomegaly or masses detected.  Musco: Warm bil, no deformities or joint swelling noted.   Neuro: alert, no focal deficits noted.    Skin: Warm, no lesions or rashes  No recent cxr on file        Assessment & Plan:   Outpatient Encounter Prescriptions  as of 01/09/2015  Medication Sig  . ACCU-CHEK AVIVA PLUS test strip as directed.   Marland Kitchen. albuterol (PROVENTIL HFA;VENTOLIN HFA) 108 (90 BASE) MCG/ACT inhaler Inhale 2 puffs into the lungs every 6 (six) hours as needed for wheezing or shortness of breath.  Marland Kitchen. albuterol (PROVENTIL) (2.5 MG/3ML) 0.083% nebulizer solution Take 3 mLs (2.5 mg total) by nebulization every 6 (six) hours as needed for wheezing or shortness of breath. Dx Code J41.8  . arformoterol (BROVANA) 15 MCG/2ML NEBU Take 2 mLs (15 mcg total) by nebulization 2 (two) times daily.  . calcium carbonate (TUMS - DOSED IN MG ELEMENTAL CALCIUM) 500 MG chewable tablet Chew 2 tablets by mouth daily. As needed  . ferrous sulfate 325 (65 FE) MG tablet Take 325 mg by mouth 2 (two) times daily with a meal.  . glimepiride (AMARYL) 4 MG tablet Take 4 mg by mouth daily with breakfast.  . guaiFENesin (ROBITUSSIN) 100 MG/5ML liquid Take 200 mg by mouth 3 (three) times daily as needed for cough.  Marland Kitchen. HUMALOG 100 UNIT/ML injection   . insulin regular (NOVOLIN R,HUMULIN R) 100 units/mL injection Inject into the skin. Sliding Scale as directed  . Lactobacillus Rhamnosus, GG, (CULTURELLE PO) Take 1 capsule by mouth daily.  . metFORMIN (GLUCOPHAGE) 1000 MG tablet Take 1,000 mg by mouth 2 (two) times daily.   . metolazone (ZAROXOLYN) 2.5 MG tablet Take 2.5 mg by mouth. Take 1 tablet by mouth Tuesday and Friday  . Nebulizers (COMPRESSOR/NEBULIZER) MISC Use with brovana  . pantoprazole (PROTONIX) 40 MG tablet Take 40 mg by mouth daily.  . potassium chloride (KLOR-CON) 20 MEQ packet Take 20 mEq by mouth daily.  . pravastatin (PRAVACHOL) 40 MG tablet Take 40 mg by mouth daily.  . predniSONE (DELTASONE) 5 MG tablet Take 5 mg by mouth daily with breakfast.  . tamsulosin (FLOMAX) 0.4 MG CAPS capsule Take 0.8 mg by mouth at bedtime.   . traZODone (DESYREL) 50 MG tablet Take 50 mg by mouth at bedtime.  . VOLTAREN 1 % GEL Apply topically daily.   Marland Kitchen. amiodarone (PACERONE)  200 MG tablet Take 200 mg by mouth daily.  . bisoprolol (ZEBETA) 5 MG tablet One twice daily  . fexofenadine (ALLEGRA) 180 MG tablet Take 180 mg by mouth daily. As needed  . fish oil-omega-3 fatty acids 1000 MG capsule Take 1 g by mouth 2 (two) times daily.   . fluticasone (FLONASE) 50 MCG/ACT nasal spray Place 1 spray into both nostrils daily.  Marland Kitchen. terbinafine (LAMISIL) 250 MG tablet Take 250 mg by mouth daily.  Marland Kitchen. torsemide (DEMADEX) 20 MG tablet Take 40 mg by mouth daily.  . [DISCONTINUED] carvedilol (COREG) 25 MG tablet Take 25 mg by mouth 2 (two) times daily with a meal.    No facility-administered encounter medications on file as  of 01/09/2015.

## 2015-01-09 NOTE — Patient Instructions (Addendum)
Try off the coreg and on bisoprolol 5 mg twice daily instead to see if breathing improves   Please schedule a follow up office visit in 6 weeks, call sooner if needed  Late add needs cxr next ov

## 2015-01-10 ENCOUNTER — Telehealth: Payer: Self-pay | Admitting: Internal Medicine

## 2015-01-10 DIAGNOSIS — J9611 Chronic respiratory failure with hypoxia: Secondary | ICD-10-CM | POA: Insufficient documentation

## 2015-01-10 MED ORDER — BISOPROLOL FUMARATE 5 MG PO TABS: ORAL_TABLET | ORAL | Status: AC

## 2015-01-10 NOTE — Assessment & Plan Note (Addendum)
Cleda DaubSpiro 04/11/2014: FEV1 29% predicted FVC 35% predicted FEV1 to FVC ratio 61% predicted  This patient has very severe COPD and is borderline hypercarbic per his most recent bicarbonate levels. In this setting I don't believe it safe to continue nonspecific beta blockers when more specific beta blockers are available. Please see retention assessment and plan  The goal with a chronic steroid dependent illness is always arriving at the lowest effective dose that controls the disease/symptoms and not accepting a set "formula" which is based on statistics or guidelines that don't always take into account patient  variability or the natural hx of the dz in every individual patient, which may well vary over time.  For now therefore I recommend the patient maintain  A floor of 5 mg per day for now and consider adding back budesonide if flares on more specific beta blockers while on brovana.  I had an extended discussion with the patient reviewing all relevant studies completed to date and  lasting 25 minutes of a 45minute transition of care ov   Each maintenance medication was reviewed in detail including most importantly the difference between maintenance and prns and under what circumstances the prns are to be triggered using an action plan format that is not reflected in the computer generated alphabetically organized AVS.    Please see instructions for details which were reviewed in writing and the patient given a copy highlighting the part that I personally wrote and discussed at today's ov.

## 2015-01-10 NOTE — Telephone Encounter (Signed)
Last ov on 01/09/15 with MW Patient Instructions     Try off the coreg and on bisoprolol 5 mg twice daily instead to see if breathing improves   Please schedule a follow up office visit in 6 weeks, call sooner if needed  Late add needs cxr next ov    [name] and [facility] stated she was gone for the day. [facility] me to [place]'s nurse for the evening shift. I informed her that the coreg was d/c at his ov on 01/09/15 and that it was replaced with bisoprolol 5mg  BID. She stated they just needed clarification on meds. She voiced understanding and had no further questions. Nothing further needed.

## 2015-01-10 NOTE — Assessment & Plan Note (Signed)
Complicated by osa/dm/ hyperlipidemia  Body mass index is 32.54     No results found for: TSH   Contributing to gerd tendency/ doe restrictive component/reviewed the need and the process to achieve and maintain neg calorie balance > defer f/u primary care including intermittently monitoring thyroid status

## 2015-01-10 NOTE — Assessment & Plan Note (Signed)
Change to Coreg to bisoprolol 01/09/2015 due to sever copd / steroid dep AB component   Since this patient is severe and has an asthmatic component that apparently is steroid dependent Strongly prefer in this setting: Bystolic, the most beta -1  selective Beta blocker available in sample form, with bisoprolol the most selective generic choice  on the market.   Try bisoprolol 5 mg bid and then return to see if doing as well and taper pred down if not off at that point

## 2015-02-20 ENCOUNTER — Ambulatory Visit: Payer: Medicare Other | Admitting: Internal Medicine

## 2015-03-04 DEATH — deceased

## 2015-03-26 ENCOUNTER — Ambulatory Visit: Payer: Medicare Other | Admitting: Internal Medicine
